# Patient Record
Sex: Female | Born: 1960 | Race: Black or African American | Hispanic: No | Marital: Married | Smoking: Never smoker
Health system: Southern US, Community
[De-identification: ages and names within clinical notes are randomized; demographics above are authoritative.]

## PROBLEM LIST (undated history)

## (undated) DIAGNOSIS — F32A Depression, unspecified: Secondary | ICD-10-CM

## (undated) DIAGNOSIS — F329 Major depressive disorder, single episode, unspecified: Secondary | ICD-10-CM

## (undated) DIAGNOSIS — J45909 Unspecified asthma, uncomplicated: Secondary | ICD-10-CM

## (undated) HISTORY — DX: Unspecified asthma, uncomplicated: J45.909

## (undated) HISTORY — DX: Depression, unspecified: F32.A

## (undated) HISTORY — PX: TONSILLECTOMY: SUR1361

## (undated) HISTORY — PX: ABLATION: SHX5711

## (undated) HISTORY — DX: Major depressive disorder, single episode, unspecified: F32.9

## (undated) HISTORY — PX: APPENDECTOMY: SHX54

## (undated) HISTORY — PX: EYE SURGERY: SHX253

---

## 2003-08-10 ENCOUNTER — Ambulatory Visit (HOSPITAL_COMMUNITY): Admission: RE | Admit: 2003-08-10 | Discharge: 2003-08-11 | Payer: Self-pay | Admitting: Ophthalmology

## 2003-08-10 ENCOUNTER — Encounter: Payer: Self-pay | Admitting: Ophthalmology

## 2003-10-26 ENCOUNTER — Ambulatory Visit (HOSPITAL_COMMUNITY): Admission: RE | Admit: 2003-10-26 | Discharge: 2003-10-27 | Payer: Self-pay | Admitting: Ophthalmology

## 2005-05-22 ENCOUNTER — Emergency Department: Payer: Self-pay | Admitting: Emergency Medicine

## 2005-10-09 ENCOUNTER — Ambulatory Visit: Payer: Self-pay | Admitting: Family Medicine

## 2007-04-18 ENCOUNTER — Emergency Department: Payer: Self-pay | Admitting: Emergency Medicine

## 2007-04-18 ENCOUNTER — Other Ambulatory Visit: Payer: Self-pay

## 2007-09-03 ENCOUNTER — Ambulatory Visit: Payer: Self-pay

## 2007-09-05 ENCOUNTER — Ambulatory Visit: Payer: Self-pay

## 2007-10-28 ENCOUNTER — Ambulatory Visit: Payer: Self-pay

## 2010-04-30 ENCOUNTER — Emergency Department: Payer: Self-pay | Admitting: Emergency Medicine

## 2010-12-01 ENCOUNTER — Emergency Department: Payer: Self-pay | Admitting: Emergency Medicine

## 2011-02-17 ENCOUNTER — Ambulatory Visit: Payer: Self-pay | Admitting: Family Medicine

## 2011-03-17 ENCOUNTER — Emergency Department: Payer: Self-pay | Admitting: Emergency Medicine

## 2012-04-08 ENCOUNTER — Encounter (INDEPENDENT_AMBULATORY_CARE_PROVIDER_SITE_OTHER): Payer: BC Managed Care – PPO

## 2012-04-08 ENCOUNTER — Other Ambulatory Visit: Payer: Self-pay | Admitting: Cardiology

## 2012-04-08 DIAGNOSIS — M79609 Pain in unspecified limb: Secondary | ICD-10-CM

## 2012-04-08 DIAGNOSIS — M7989 Other specified soft tissue disorders: Secondary | ICD-10-CM

## 2012-04-08 DIAGNOSIS — R52 Pain, unspecified: Secondary | ICD-10-CM

## 2012-04-08 DIAGNOSIS — R609 Edema, unspecified: Secondary | ICD-10-CM

## 2012-05-18 ENCOUNTER — Emergency Department: Payer: Self-pay | Admitting: Emergency Medicine

## 2012-05-18 LAB — CBC WITH DIFFERENTIAL/PLATELET
Basophil #: 0 10*3/uL (ref 0.0–0.1)
Basophil %: 0.2 %
Eosinophil #: 0 10*3/uL (ref 0.0–0.7)
Eosinophil %: 0.1 %
HCT: 38.7 % (ref 35.0–47.0)
HGB: 13.3 g/dL (ref 12.0–16.0)
Lymphocyte #: 1 10*3/uL (ref 1.0–3.6)
Lymphocyte %: 9.4 %
MCH: 31.6 pg (ref 26.0–34.0)
MCHC: 34.4 g/dL (ref 32.0–36.0)
MCV: 92 fL (ref 80–100)
Monocyte #: 0.6 x10 3/mm (ref 0.2–0.9)
Monocyte %: 5.8 %
Neutrophil #: 9.5 10*3/uL — ABNORMAL HIGH (ref 1.4–6.5)
Neutrophil %: 84.5 %
Platelet: 315 10*3/uL (ref 150–440)
RBC: 4.21 10*6/uL (ref 3.80–5.20)
RDW: 13.3 % (ref 11.5–14.5)
WBC: 11.2 10*3/uL — ABNORMAL HIGH (ref 3.6–11.0)

## 2012-05-18 LAB — URINALYSIS, COMPLETE
Bacteria: NONE SEEN
Bilirubin,UR: NEGATIVE
Glucose,UR: NEGATIVE mg/dL (ref 0–75)
Ketone: NEGATIVE
Nitrite: NEGATIVE
Ph: 6 (ref 4.5–8.0)
Protein: 30
RBC,UR: 53 /HPF (ref 0–5)
Specific Gravity: 1.026 (ref 1.003–1.030)
Squamous Epithelial: 10
WBC UR: 56 /HPF (ref 0–5)

## 2012-05-18 LAB — COMPREHENSIVE METABOLIC PANEL
Albumin: 3.5 g/dL (ref 3.4–5.0)
Alkaline Phosphatase: 85 U/L (ref 50–136)
Anion Gap: 10 (ref 7–16)
BUN: 15 mg/dL (ref 7–18)
Bilirubin,Total: 0.3 mg/dL (ref 0.2–1.0)
Calcium, Total: 8.8 mg/dL (ref 8.5–10.1)
Chloride: 106 mmol/L (ref 98–107)
Co2: 23 mmol/L (ref 21–32)
Creatinine: 0.9 mg/dL (ref 0.60–1.30)
EGFR (African American): >60
EGFR (Non-African Amer.): >60
Glucose: 88 mg/dL (ref 65–99)
Osmolality: 278 (ref 275–301)
Potassium: 3.6 mmol/L (ref 3.5–5.1)
SGOT(AST): 25 U/L (ref 15–37)
SGPT (ALT): 23 U/L
Sodium: 139 mmol/L (ref 136–145)
Total Protein: 7.5 g/dL (ref 6.4–8.2)

## 2012-05-18 LAB — PROTIME-INR
INR: 0.9
Prothrombin Time: 12.8 secs (ref 11.5–14.7)

## 2012-05-18 LAB — APTT: Activated PTT: 32 secs (ref 23.6–35.9)

## 2012-12-31 ENCOUNTER — Ambulatory Visit: Payer: Self-pay | Admitting: Family Medicine

## 2014-02-03 ENCOUNTER — Ambulatory Visit: Payer: Self-pay

## 2014-05-18 ENCOUNTER — Emergency Department: Payer: Self-pay | Admitting: Emergency Medicine

## 2014-05-18 LAB — COMPREHENSIVE METABOLIC PANEL
Albumin: 3.8 g/dL (ref 3.4–5.0)
Alkaline Phosphatase: 87 U/L
Anion Gap: 7 (ref 7–16)
BUN: 9 mg/dL (ref 7–18)
Bilirubin,Total: 0.3 mg/dL (ref 0.2–1.0)
Calcium, Total: 9.2 mg/dL (ref 8.5–10.1)
Chloride: 107 mmol/L (ref 98–107)
Co2: 25 mmol/L (ref 21–32)
Creatinine: 1.02 mg/dL (ref 0.60–1.30)
EGFR (African American): >60
EGFR (Non-African Amer.): >60
Glucose: 83 mg/dL (ref 65–99)
Osmolality: 275 (ref 275–301)
Potassium: 3.6 mmol/L (ref 3.5–5.1)
SGOT(AST): 31 U/L (ref 15–37)
SGPT (ALT): 33 U/L
Sodium: 139 mmol/L (ref 136–145)
Total Protein: 8.6 g/dL — ABNORMAL HIGH (ref 6.4–8.2)

## 2014-05-18 LAB — URINALYSIS, COMPLETE
Bilirubin,UR: NEGATIVE
Glucose,UR: NEGATIVE mg/dL (ref 0–75)
Ketone: NEGATIVE
Nitrite: NEGATIVE
PH: 8 (ref 4.5–8.0)
Protein: NEGATIVE
RBC,UR: 6 /HPF (ref 0–5)
Specific Gravity: 1.01 (ref 1.003–1.030)
WBC UR: 12 /HPF (ref 0–5)

## 2014-05-18 LAB — CK TOTAL AND CKMB (NOT AT ARMC)
CK, Total: 181 U/L
CK-MB: 1.6 ng/mL (ref 0.5–3.6)

## 2014-05-18 LAB — CBC
HCT: 43.7 % (ref 35.0–47.0)
HGB: 14.4 g/dL (ref 12.0–16.0)
MCH: 30.7 pg (ref 26.0–34.0)
MCHC: 32.9 g/dL (ref 32.0–36.0)
MCV: 93 fL (ref 80–100)
Platelet: 396 10*3/uL (ref 150–440)
RBC: 4.68 10*6/uL (ref 3.80–5.20)
RDW: 14 % (ref 11.5–14.5)
WBC: 8.8 10*3/uL (ref 3.6–11.0)

## 2014-05-18 LAB — TROPONIN I: Troponin-I: <0.02 ng/mL

## 2014-05-20 LAB — URINE CULTURE

## 2015-04-09 ENCOUNTER — Encounter: Payer: Self-pay | Admitting: Family Medicine

## 2015-04-09 ENCOUNTER — Ambulatory Visit (INDEPENDENT_AMBULATORY_CARE_PROVIDER_SITE_OTHER): Payer: BLUE CROSS/BLUE SHIELD | Admitting: Family Medicine

## 2015-04-09 DIAGNOSIS — J4521 Mild intermittent asthma with (acute) exacerbation: Secondary | ICD-10-CM | POA: Diagnosis not present

## 2015-04-09 DIAGNOSIS — J45909 Unspecified asthma, uncomplicated: Secondary | ICD-10-CM | POA: Insufficient documentation

## 2015-04-09 DIAGNOSIS — J45901 Unspecified asthma with (acute) exacerbation: Secondary | ICD-10-CM | POA: Insufficient documentation

## 2015-04-09 DIAGNOSIS — F32A Depression, unspecified: Secondary | ICD-10-CM | POA: Insufficient documentation

## 2015-04-09 DIAGNOSIS — F329 Major depressive disorder, single episode, unspecified: Secondary | ICD-10-CM | POA: Insufficient documentation

## 2015-04-09 MED ORDER — ALBUTEROL SULFATE HFA 108 (90 BASE) MCG/ACT IN AERS: 1.0000 | INHALATION_SPRAY | Freq: Four times a day (QID) | RESPIRATORY_TRACT | Status: DC

## 2015-04-09 MED ORDER — GUAIFENESIN-CODEINE 100-10 MG/5ML PO SOLN: 5.0000 mL | Freq: Three times a day (TID) | ORAL | Status: DC | PRN

## 2015-04-09 MED ORDER — ALBUTEROL SULFATE (2.5 MG/3ML) 0.083% IN NEBU: 2.5000 mg | INHALATION_SOLUTION | Freq: Once | RESPIRATORY_TRACT | Status: AC

## 2015-04-09 MED ORDER — AZITHROMYCIN 250 MG PO TABS: ORAL_TABLET | ORAL | Status: DC

## 2015-04-09 MED ORDER — PREDNISONE 10 MG PO TABS: 10.0000 mg | ORAL_TABLET | Freq: Every day | ORAL | Status: DC

## 2015-04-09 MED ORDER — MOMETASONE FURO-FORMOTEROL FUM 200-5 MCG/ACT IN AERO: 1.0000 | INHALATION_SPRAY | Freq: Two times a day (BID) | RESPIRATORY_TRACT | Status: DC

## 2015-04-09 NOTE — Progress Notes (Signed)
Name: Beverly Mercer   MRN: [id number]    DOB: Dec 30, 1960   Date:04/09/2015       Progress Note  Subjective  Chief Complaint  Chief Complaint  Patient presents with  . Asthma    needs refill on Dulera    Asthma She complains of chest tightness, cough, difficulty breathing, frequent throat clearing, shortness of breath and wheezing. There is no hemoptysis, hoarse voice or sputum production. This is a chronic problem. The current episode started in the past 7 days. The problem occurs constantly. The problem has been unchanged. The cough is non-productive. Pertinent negatives include no chest pain, dyspnea on exertion, ear pain, fever, heartburn or PND. Her symptoms are aggravated by change in weather and pollen. Her symptoms are alleviated by beta-agonist. Her past medical history is significant for asthma. There is no history of bronchiectasis, bronchitis or COPD.    No problem-specific assessment & plan notes found for this encounter.   Past Medical History  Diagnosis Date  . Asthma     History reviewed. No pertinent past surgical history.  Family History  Problem Relation Age of Onset  . Asthma Neg Hx     History   Social History  . Marital Status: Married    Spouse Name: N/A  . Number of Children: N/A  . Years of Education: N/A   Occupational History  . Not on file.   Social History Main Topics  . Smoking status: Never Smoker   . Smokeless tobacco: Not on file  . Alcohol Use: No  . Drug Use: No  . Sexual Activity: Not Currently   Other Topics Concern  . Not on file   Social History Narrative  . No narrative on file    Allergies  Allergen Reactions  . Demerol [Meperidine]      Review of Systems  Constitutional: Negative for fever.  HENT: Negative for ear pain and hoarse voice.   Respiratory: Positive for cough, shortness of breath and wheezing. Negative for hemoptysis and sputum production.   Cardiovascular: Negative for chest pain, dyspnea on  exertion and PND.  Gastrointestinal: Negative for heartburn.     Objective  Filed Vitals:   04/09/15 1136  BP: 130/80  Pulse: 84  Height: 5\' 2"  (1.575 m)  Weight: 173 lb (78.472 kg)  SpO2: 99%    Physical Exam    Assessment & Plan  Problem List Items Addressed This Visit      Respiratory   RAD (reactive airway disease)   Relevant Medications   mometasone-formoterol (DULERA) 200-5 MCG/ACT AERO   albuterol (PROAIR HFA) 108 (90 BASE) MCG/ACT inhaler   azithromycin (ZITHROMAX) 250 MG tablet   predniSONE (DELTASONE) 10 MG tablet   guaiFENesin-codeine 100-10 MG/5ML syrup   albuterol (PROVENTIL) (2.5 MG/3ML) 0.083% nebulizer solution 2.5 mg (Start on 04/09/2015 12:30 PM)        Dr. [facility]  04/09/2015

## 2015-04-10 ENCOUNTER — Emergency Department
Admission: EM | Admit: 2015-04-10 | Discharge: 2015-04-10 | Disposition: A | Discharge status: Home / Self Care | Payer: BLUE CROSS/BLUE SHIELD | Attending: Emergency Medicine | Admitting: Emergency Medicine

## 2015-04-10 ENCOUNTER — Other Ambulatory Visit: Payer: Self-pay

## 2015-04-10 ENCOUNTER — Emergency Department: Payer: BLUE CROSS/BLUE SHIELD

## 2015-04-10 ENCOUNTER — Encounter: Payer: Self-pay | Admitting: Emergency Medicine

## 2015-04-10 DIAGNOSIS — J4521 Mild intermittent asthma with (acute) exacerbation: Secondary | ICD-10-CM | POA: Diagnosis not present

## 2015-04-10 DIAGNOSIS — R05 Cough: Secondary | ICD-10-CM | POA: Diagnosis present

## 2015-04-10 DIAGNOSIS — J209 Acute bronchitis, unspecified: Secondary | ICD-10-CM

## 2015-04-10 DIAGNOSIS — Z792 Long term (current) use of antibiotics: Secondary | ICD-10-CM | POA: Diagnosis not present

## 2015-04-10 DIAGNOSIS — Z7952 Long term (current) use of systemic steroids: Secondary | ICD-10-CM | POA: Insufficient documentation

## 2015-04-10 LAB — COMPREHENSIVE METABOLIC PANEL
ALBUMIN: 4.3 g/dL (ref 3.5–5.0)
ALT: 23 U/L (ref 14–54)
AST: 32 U/L (ref 15–41)
Alkaline Phosphatase: 78 U/L (ref 38–126)
Anion gap: 8 (ref 5–15)
BUN: 14 mg/dL (ref 6–20)
CHLORIDE: 106 mmol/L (ref 101–111)
CO2: 24 mmol/L (ref 22–32)
CREATININE: 0.86 mg/dL (ref 0.44–1.00)
Calcium: 9.7 mg/dL (ref 8.9–10.3)
GFR calc Af Amer: >60 mL/min (ref >60)
GFR calc non Af Amer: >60 mL/min (ref >60)
Glucose, Bld: 141 mg/dL — ABNORMAL HIGH (ref 65–99)
Potassium: 4 mmol/L (ref 3.5–5.1)
Sodium: 138 mmol/L (ref 135–145)
TOTAL PROTEIN: 8.4 g/dL — AB (ref 6.5–8.1)
Total Bilirubin: 0.4 mg/dL (ref 0.3–1.2)

## 2015-04-10 LAB — CBC
HCT: 42.4 % (ref 35.0–47.0)
HEMOGLOBIN: 13.9 g/dL (ref 12.0–16.0)
MCH: 31.6 pg (ref 26.0–34.0)
MCHC: 32.9 g/dL (ref 32.0–36.0)
MCV: 96 fL (ref 80.0–100.0)
Platelets: 377 10*3/uL (ref 150–440)
RBC: 4.42 MIL/uL (ref 3.80–5.20)
RDW: 14.3 % (ref 11.5–14.5)
WBC: 6.7 10*3/uL (ref 3.6–11.0)

## 2015-04-10 LAB — TROPONIN I

## 2015-04-10 MED ORDER — IPRATROPIUM-ALBUTEROL 0.5-2.5 (3) MG/3ML IN SOLN
RESPIRATORY_TRACT | Status: AC
  Filled 2015-04-10: qty 3

## 2015-04-10 MED ORDER — IPRATROPIUM-ALBUTEROL 0.5-2.5 (3) MG/3ML IN SOLN
RESPIRATORY_TRACT | Status: AC
  Administered 2015-04-10: 3 mL via RESPIRATORY_TRACT
  Filled 2015-04-10: qty 3

## 2015-04-10 MED ORDER — PREDNISONE 20 MG PO TABS
ORAL_TABLET | ORAL | Status: AC
  Administered 2015-04-10: 60 mg via ORAL
  Filled 2015-04-10: qty 3

## 2015-04-10 MED ORDER — PREDNISONE 20 MG PO TABS
60.0000 mg | ORAL_TABLET | Freq: Once | ORAL | Status: AC
  Administered 2015-04-10: 60 mg via ORAL

## 2015-04-10 MED ORDER — IPRATROPIUM-ALBUTEROL 0.5-2.5 (3) MG/3ML IN SOLN
3.0000 mL | Freq: Once | RESPIRATORY_TRACT | Status: AC
  Administered 2015-04-10: 3 mL via RESPIRATORY_TRACT

## 2015-04-10 NOTE — ED Provider Notes (Signed)
EKG interpreted by me Normal sinus rhythm rate of 77, normal axis and intervals, poor R progression in anterior precordial leads, normal ST segments and T waves.  Sharman Cheek, MD 04/10/15 1620

## 2015-04-10 NOTE — ED Notes (Signed)
Patient states she saw PCP yesterday for asthma attack, was started on antibiotics. Patient denies any productive sputum, but reports "lots of coughing".

## 2015-04-10 NOTE — Discharge Instructions (Signed)
Asthma, Acute Bronchospasm °Acute bronchospasm caused by asthma is also referred to as an asthma attack. Bronchospasm means your air passages become narrowed. The narrowing is caused by inflammation and tightening of the muscles in the air tubes (bronchi) in your lungs. This can make it hard to breathe or cause you to wheeze and cough. °CAUSES °Possible triggers are: °· Animal dander from the skin, hair, or feathers of animals. °· Dust mites contained in house dust. °· Cockroaches. °· Pollen from trees or grass. °· Mold. °· Cigarette or tobacco smoke. °· Air pollutants such as dust, household cleaners, hair sprays, aerosol sprays, paint fumes, strong chemicals, or strong odors. °· Cold air or weather changes. Cold air may trigger inflammation. Winds increase molds and pollens in the air. °· Strong emotions such as crying or laughing hard. °· Stress. °· Certain medicines such as aspirin or beta-blockers. °· Sulfites in foods and drinks, such as dried fruits and wine. °· Infections or inflammatory conditions, such as a flu, cold, or inflammation of the nasal membranes (rhinitis). °· Gastroesophageal reflux disease (GERD). GERD is a condition where stomach acid backs up into your esophagus. °· Exercise or strenuous activity. °SIGNS AND SYMPTOMS  °· Wheezing. °· Excessive coughing, particularly at night. °· Chest tightness. °· Shortness of breath. °DIAGNOSIS  °Your health care provider will ask you about your medical history and perform a physical exam. A chest X-ray or blood testing may be performed to look for other causes of your symptoms or other conditions that may have triggered your asthma attack.  °TREATMENT  °Treatment is aimed at reducing inflammation and opening up the airways in your lungs.  Most asthma attacks are treated with inhaled medicines. These include quick relief or rescue medicines (such as bronchodilators) and controller medicines (such as inhaled corticosteroids). These medicines are sometimes  given through an inhaler or a nebulizer. Systemic steroid medicine taken by mouth or given through an IV tube also can be used to reduce the inflammation when an attack is moderate or severe. Antibiotic medicines are only used if a bacterial infection is present.  °HOME CARE INSTRUCTIONS  °· Rest. °· Drink plenty of liquids. This helps the mucus to remain thin and be easily coughed up. Only use caffeine in moderation and do not use alcohol until you have recovered from your illness. °· Do not smoke. Avoid being exposed to secondhand smoke. °· You play a critical role in keeping yourself in good health. Avoid exposure to things that cause you to wheeze or to have breathing problems. °· Keep your medicines up-to-date and available. Carefully follow your health care provider's treatment plan. °· Take your medicine exactly as prescribed. °· When pollen or pollution is bad, keep windows closed and use an air conditioner or go to places with air conditioning. °· Asthma requires careful medical care. See your health care provider for a follow-up as advised. If you are more than [redacted] weeks pregnant and you were prescribed any new medicines, let your obstetrician know about the visit and how you are doing. Follow up with your health care provider as directed. °· After you have recovered from your asthma attack, make an appointment with your outpatient doctor to talk about ways to reduce the likelihood of future attacks. If you do not have a doctor who manages your asthma, make an appointment with a primary care doctor to discuss your asthma. °SEEK IMMEDIATE MEDICAL CARE IF:  °· You are getting worse. °· You have trouble breathing. If severe, call your local   emergency services (911 in the [place]  You develop chest pain or discomfort.  You are vomiting.  You are not able to keep fluids down.  You are coughing up yellow, green, brown, or bloody sputum.  You have a fever and your symptoms suddenly get worse.  You have  trouble swallowing. MAKE SURE YOU:   Understand these instructions.  Will watch your condition.  Will get help right away if you are not doing well or get worse. Document Released: 01/24/2007 Document Revised: 10/14/2013 Document Reviewed: 04/16/2013 [facility] Patient Information 2015 [place], [place]. This information is not intended to replace advice given to you by your health care provider. Make sure you discuss any questions you have with your health care provider.   FOLLOW UP WITH YOUR DOCTOR ON Monday IF ANY CONTINUED PROBLEMS  BEGIN WITH PREDNISONE TABLETS 5 PILLS TOMORROW AND TAPER DOWN AS ALREADY DISCUSSED CONTINUE INHALER AND COUGH MEDICATION, FINISH YOUR ZITHROMAX RETURN TO ER IF ANY SEVERE WORSENING OF YOUR ASTHMA

## 2015-04-10 NOTE — ED Notes (Signed)
NAD noted at time of D/C. Pt denies questions or concerns. Pt ambulatory to the lobby at this time. Pt refused wheelchair at this time.  

## 2015-04-10 NOTE — ED Provider Notes (Signed)
[facility] Emergency Department Provider Note  ____________________________________________  Time seen: 1600  I have reviewed the triage vital signs and the nursing notes.   HISTORY  Chief Complaint Asthma; Cough; and Shortness of Breath   HPI Beverly Mercer is a 54 y.o. female patient is here today with complaint of continued cough. She states she was seen by her PCP yesterday and started on Zithromax, a codeine cough syrup, albuterol and prednisone. She was unable to sleep last evening due to coughing.She has a history of asthma and has been on the above medications in the past. She denies any smoking. She is unaware of any fever. Currently she rates her pain 7 out of 10 mostly from coughing. Nothing seems to be improving her cough including cough medicine. On looking at the bilateral her prednisone medication is for one per day. Patient states this is the lowest dose of prednisone she's ever been on for her asthma attacks.   Past Medical History  Diagnosis Date  . Asthma     Patient Active Problem List   Diagnosis Date Noted  . Acute severe exacerbation of asthma 04/09/2015  . Clinical depression 04/09/2015  . RAD (reactive airway disease) 04/09/2015    Past Surgical History  Procedure Laterality Date  . Tonsillectomy    . Appendectomy    . Eye surgery    . Ablation      Uterus    Current Outpatient Rx  Name  Route  Sig  Dispense  Refill  . albuterol (PROAIR HFA) 108 (90 BASE) MCG/ACT inhaler   Inhalation   Inhale 1 puff into the lungs 4 (four) times daily.   1 Inhaler   11   . azithromycin (ZITHROMAX) 250 MG tablet      2 today then one a day   6 tablet   0   . guaiFENesin-codeine 100-10 MG/5ML syrup   Oral   Take 5 mLs by mouth 3 (three) times daily as needed for cough.   120 mL   0   . mometasone-formoterol (DULERA) 200-5 MCG/ACT AERO   Inhalation   Inhale 1 puff into the lungs 2 (two) times daily.   1 Inhaler   11   .  predniSONE (DELTASONE) 10 MG tablet   Oral   Take 1 tablet (10 mg total) by mouth daily with breakfast.   33 tablet   0   . sertraline (ZOLOFT) 50 MG tablet   Oral   Take 1 tablet by mouth daily.           Allergies Demerol  Family History  Problem Relation Age of Onset  . Asthma Neg Hx     Social History History  Substance Use Topics  . Smoking status: Never Smoker   . Smokeless tobacco: Not on file  . Alcohol Use: No    Review of Systems Constitutional: No fever/chills Eyes: No visual changes. ENT: No sore throat. Cardiovascular: Positive chest pain due to frequent coughing per patient. Respiratory: Denies shortness of breath. Productive cough positive Gastrointestinal: No abdominal pain.  No nausea, no vomiting.  Genitourinary: Negative for dysuria. Musculoskeletal: Negative for back pain. Skin: Negative for rash. Neurological: Negative for headaches, focal weakness or numbness.  10-point ROS otherwise negative.  ____________________________________________   PHYSICAL EXAM:  VITAL SIGNS: ED Triage Vitals  Enc Vitals Group     BP 04/10/15 1155 140/76 mmHg     Pulse Rate 04/10/15 1155 77     Resp 04/10/15 1155 24  Temp 04/10/15 1155 99.1 F (37.3 C)     Temp Source 04/10/15 1155 Oral     SpO2 04/10/15 1155 97 %     Weight 04/10/15 1155 170 lb (77.111 kg)     Height 04/10/15 1155  (1.575 m)     Head Cir --      Peak Flow --      Pain Score 04/10/15 1156 7     Pain Loc --      Pain Edu? --      Excl. in GC? --     Constitutional: Alert and oriented. Well appearing and in no acute distress. Eyes: Conjunctivae are normal. PERRL. EOMI. Head: Atraumatic. Nose: No congestion/rhinnorhea. Mouth/Throat: Mucous membranes are moist.  Oropharynx non-erythematous. Neck: No stridor.  Supple Hematological/Lymphatic/Immunilogical: No cervical lymphadenopathy. Cardiovascular: Normal rate, regular rhythm. Grossly normal heart sounds.  Good peripheral  circulation. Respiratory: Normal respiratory effort.  No retractions. Lungs bilateral expiratory wheezes noted throughout. Patient is able to talk in complete sentences but does have frequent cough Gastrointestinal: Soft and nontender. No distention.  Musculoskeletal: No lower extremity tenderness nor edema.  No joint effusions. Neurologic:  Normal speech and language. No gross focal neurologic deficits are appreciated. Speech is normal. No gait instability. Skin:  Skin is warm, dry and intact. No rash noted. Psychiatric: Mood and affect are normal. Speech and behavior are normal.  ____________________________________________   LABS (all labs ordered are listed, but only abnormal results are displayed)  Labs Reviewed  COMPREHENSIVE METABOLIC PANEL - Abnormal; Notable for the following:    Glucose, Bld 141 (*)    Total Protein 8.4 (*)    All other components within normal limits  CBC  TROPONIN I   ____________________________________________  EKG  Normal sinus rhythm with a rate of 77 also note was dictated by Dr. Scotty Court ____________________________________________  RADIOLOGY  Chest x-ray shows stable mild chronic bronchitic changes. No acute abnormality ____________________________________________   PROCEDURES  Procedure(s) performed: None  Critical Care performed: No  ____________________________________________   INITIAL IMPRESSION / ASSESSMENT AND PLAN / ED COURSE  Pertinent labs & imaging results that were available during my care of the patient were reviewed by me and considered in my medical decision making (see chart for details).  Patient states that there is a huge difference after having had a DuoNeb treatment. We discussed increasing her prednisone and she was given 60 mg in the emergency room. She has not medication that she can do a taper beginning tomorrow. She is to continue her medications. She is return to the emergency room if any severe worsening of  her symptoms or urgent concerns. ____________________________________________   FINAL CLINICAL IMPRESSION(S) / ED DIAGNOSES  Final diagnoses:  Asthma with acute exacerbation, mild intermittent  Acute bronchitis, unspecified organism      Tommi Rumps, PA-C 04/10/15 1858  Sharman Cheek, MD 04/11/15 0004

## 2015-12-17 ENCOUNTER — Encounter: Payer: Self-pay | Admitting: Family Medicine

## 2015-12-17 ENCOUNTER — Ambulatory Visit (INDEPENDENT_AMBULATORY_CARE_PROVIDER_SITE_OTHER): Payer: Self-pay | Admitting: Family Medicine

## 2015-12-17 VITALS — BP 138/70 | HR 86 | Temp 97.8°F | Ht 62.0 in | Wt 176.0 lb

## 2015-12-17 DIAGNOSIS — J0100 Acute maxillary sinusitis, unspecified: Secondary | ICD-10-CM

## 2015-12-17 DIAGNOSIS — J4521 Mild intermittent asthma with (acute) exacerbation: Secondary | ICD-10-CM

## 2015-12-17 DIAGNOSIS — J40 Bronchitis, not specified as acute or chronic: Secondary | ICD-10-CM

## 2015-12-17 MED ORDER — GUAIFENESIN-CODEINE 100-10 MG/5ML PO SYRP: 5.0000 mL | ORAL_SOLUTION | Freq: Three times a day (TID) | ORAL | Status: DC | PRN

## 2015-12-17 MED ORDER — MOMETASONE FURO-FORMOTEROL FUM 200-5 MCG/ACT IN AERO: 1.0000 | INHALATION_SPRAY | Freq: Two times a day (BID) | RESPIRATORY_TRACT | Status: DC

## 2015-12-17 MED ORDER — ALBUTEROL SULFATE (2.5 MG/3ML) 0.083% IN NEBU: 2.5000 mg | INHALATION_SOLUTION | Freq: Four times a day (QID) | RESPIRATORY_TRACT | Status: AC | PRN

## 2015-12-17 MED ORDER — AZITHROMYCIN 250 MG PO TABS: ORAL_TABLET | ORAL | Status: DC

## 2015-12-17 MED ORDER — ALBUTEROL SULFATE HFA 108 (90 BASE) MCG/ACT IN AERS: 1.0000 | INHALATION_SPRAY | Freq: Four times a day (QID) | RESPIRATORY_TRACT | Status: AC

## 2015-12-17 NOTE — Progress Notes (Signed)
Name: Beverly Mercer   MRN: [id number]    DOB: 12-21-1960   Date:12/17/2015       Progress Note  Subjective  Chief Complaint  Chief Complaint  Patient presents with  . Sinusitis    cough and hoarse- no production  . Asthma    needs refill on nebulizer solution    Sinusitis This is a new problem. The current episode started in the past 7 days. The problem has been waxing and waning since onset. There has been no fever. She is experiencing no pain. Associated symptoms include chills, congestion, coughing, headaches, a hoarse voice, neck pain, shortness of breath, sinus pressure, sneezing and a sore throat. Pertinent negatives include no diaphoresis or ear pain. Past treatments include acetaminophen and oral decongestants. The treatment provided mild relief.  Asthma She complains of cough, hoarse voice and shortness of breath. There is no sputum production or wheezing. Associated symptoms include headaches, sneezing and a sore throat. Pertinent negatives include no chest pain, ear pain, fever, heartburn, malaise/fatigue, myalgias or weight loss. Her past medical history is significant for asthma.  Cough The current episode started in the past 7 days. The problem has been waxing and waning. The cough is non-productive. Associated symptoms include chills, headaches, a sore throat and shortness of breath. Pertinent negatives include no chest pain, ear pain, fever, heartburn, myalgias, rash, weight loss or wheezing. She has tried a beta-agonist inhaler and steroid inhaler for the symptoms. Her past medical history is significant for asthma. There is no history of environmental allergies.    No problem-specific assessment & plan notes found for this encounter.   Past Medical History  Diagnosis Date  . Asthma   . Depression     Past Surgical History  Procedure Laterality Date  . Tonsillectomy    . Appendectomy    . Eye surgery    . Ablation      Uterus    Family History  Problem  Relation Age of Onset  . Asthma Neg Hx     Social History   Social History  . Marital Status: Married    Spouse Name: N/A  . Number of Children: N/A  . Years of Education: N/A   Occupational History  . Not on file.   Social History Main Topics  . Smoking status: Never Smoker   . Smokeless tobacco: Not on file  . Alcohol Use: No  . Drug Use: No  . Sexual Activity: Not Currently   Other Topics Concern  . Not on file   Social History Narrative    Allergies  Allergen Reactions  . Demerol [Meperidine]      Review of Systems  Constitutional: Positive for chills. Negative for fever, weight loss, malaise/fatigue and diaphoresis.  HENT: Positive for congestion, hoarse voice, sinus pressure, sneezing and sore throat. Negative for ear discharge and ear pain.   Eyes: Negative for blurred vision.  Respiratory: Positive for cough and shortness of breath. Negative for sputum production and wheezing.   Cardiovascular: Negative for chest pain, palpitations and leg swelling.  Gastrointestinal: Negative for heartburn, nausea, abdominal pain, diarrhea, constipation, blood in stool and melena.  Genitourinary: Negative for dysuria, urgency, frequency and hematuria.  Musculoskeletal: Positive for neck pain. Negative for myalgias, back pain and joint pain.  Skin: Negative for rash.  Neurological: Positive for headaches. Negative for dizziness, tingling, sensory change and focal weakness.  Endo/Heme/Allergies: Negative for environmental allergies and polydipsia. Does not bruise/bleed easily.  Psychiatric/Behavioral: Negative for depression and suicidal  ideas. The patient is not nervous/anxious and does not have insomnia.      Objective  Filed Vitals:   12/17/15 1607  BP: 138/70  Pulse: 86  Temp: 97.8 F (36.6 C)  TempSrc: Oral  Height:  (1.575 m)  Weight: 176 lb (79.833 kg)  SpO2: 99%    Physical Exam  Constitutional: She is well-developed, well-nourished, and in no  distress. No distress.  HENT:  Head: Normocephalic and atraumatic.  Right Ear: Tympanic membrane, external ear and ear canal normal.  Left Ear: Tympanic membrane, external ear and ear canal normal.  Nose: Nose normal.  Mouth/Throat: Oropharynx is clear and moist.  Eyes: Conjunctivae and EOM are normal. Pupils are equal, round, and reactive to light. Right eye exhibits no discharge. Left eye exhibits no discharge.  Neck: Normal range of motion. Neck supple. No JVD present. No thyromegaly present.  Cardiovascular: Normal rate, regular rhythm, normal heart sounds and intact distal pulses.  Exam reveals no gallop and no friction rub.   No murmur heard. Pulmonary/Chest: Effort normal and breath sounds normal.  Abdominal: Soft. Bowel sounds are normal. She exhibits no mass. There is no tenderness. There is no guarding.  Musculoskeletal: Normal range of motion. She exhibits no edema.  Lymphadenopathy:    She has no cervical adenopathy.  Neurological: She is alert.  Skin: Skin is warm and dry. She is not diaphoretic.  Psychiatric: Mood and affect normal.      Assessment & Plan  Problem List Items Addressed This Visit      Respiratory   RAD (reactive airway disease)   Relevant Medications   albuterol (PROAIR HFA) 108 (90 Base) MCG/ACT inhaler   mometasone-formoterol (DULERA) 200-5 MCG/ACT AERO   albuterol (PROVENTIL) (2.5 MG/3ML) 0.083% nebulizer solution    Other Visit Diagnoses    Acute maxillary sinusitis, recurrence not specified    -  Primary    Relevant Medications    azithromycin (ZITHROMAX) 250 MG tablet    guaiFENesin-codeine (ROBITUSSIN AC) 100-10 MG/5ML syrup    Bronchitis        Relevant Medications    azithromycin (ZITHROMAX) 250 MG tablet    guaiFENesin-codeine (ROBITUSSIN AC) 100-10 MG/5ML syrup         Dr. [facility]  12/17/2015

## 2015-12-22 ENCOUNTER — Other Ambulatory Visit: Payer: Self-pay

## 2015-12-22 MED ORDER — DOXYCYCLINE HYCLATE 100 MG PO TABS: 100.0000 mg | ORAL_TABLET | Freq: Two times a day (BID) | ORAL | Status: DC

## 2016-04-27 ENCOUNTER — Other Ambulatory Visit: Payer: Self-pay

## 2016-04-27 MED ORDER — SERTRALINE HCL 50 MG PO TABS: 50.0000 mg | ORAL_TABLET | Freq: Every day | ORAL | Status: DC

## 2016-11-04 IMAGING — CR DG CHEST 2V
1 series · 2 of 2 positions shown · non-contrast
Comparison: 12/31/2012.

CLINICAL DATA: Cough, wheezing and shortness of breath for the past
4 days. History of asthma.

EXAM:
CHEST  2 VIEW

[Series 1: dg chest 2 view · 0.14mm/px · 2 of 2 slices shown]
[im 1/2]
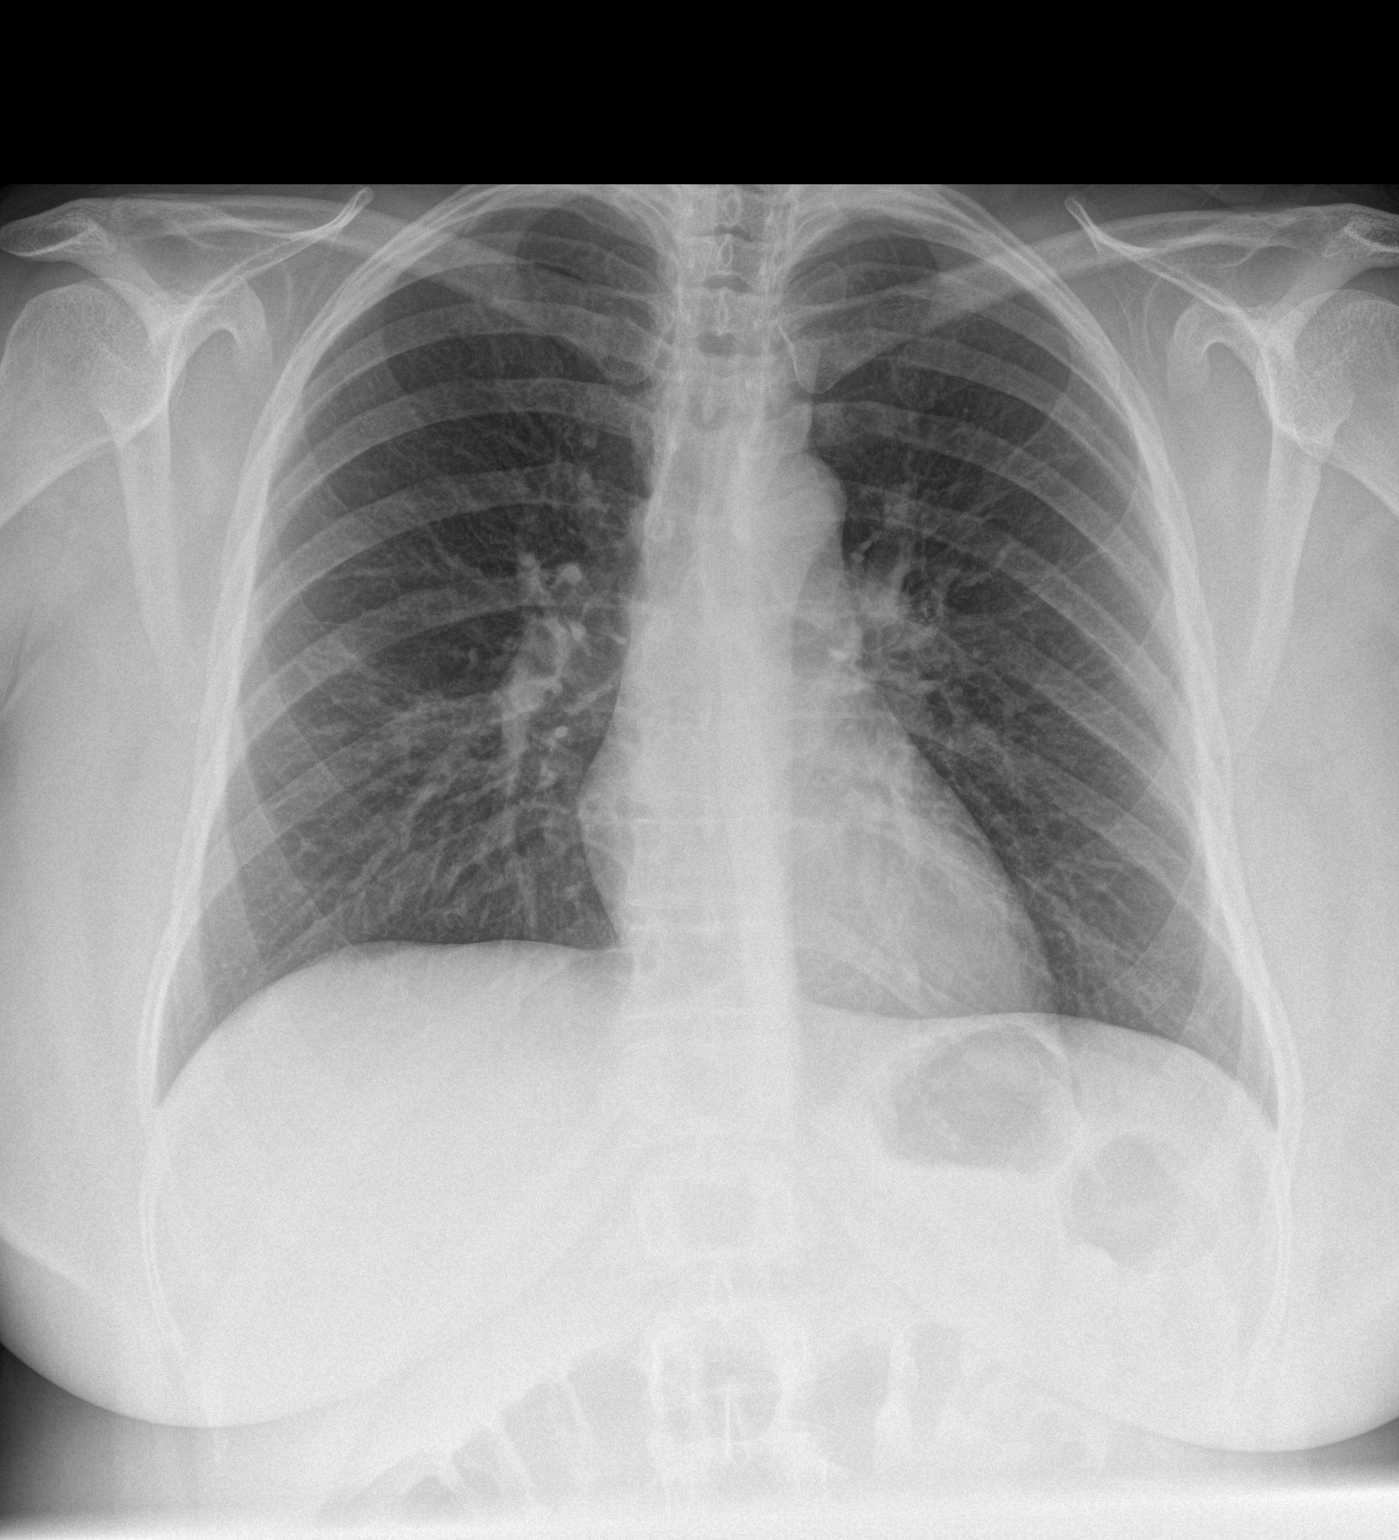
[im 2/2]
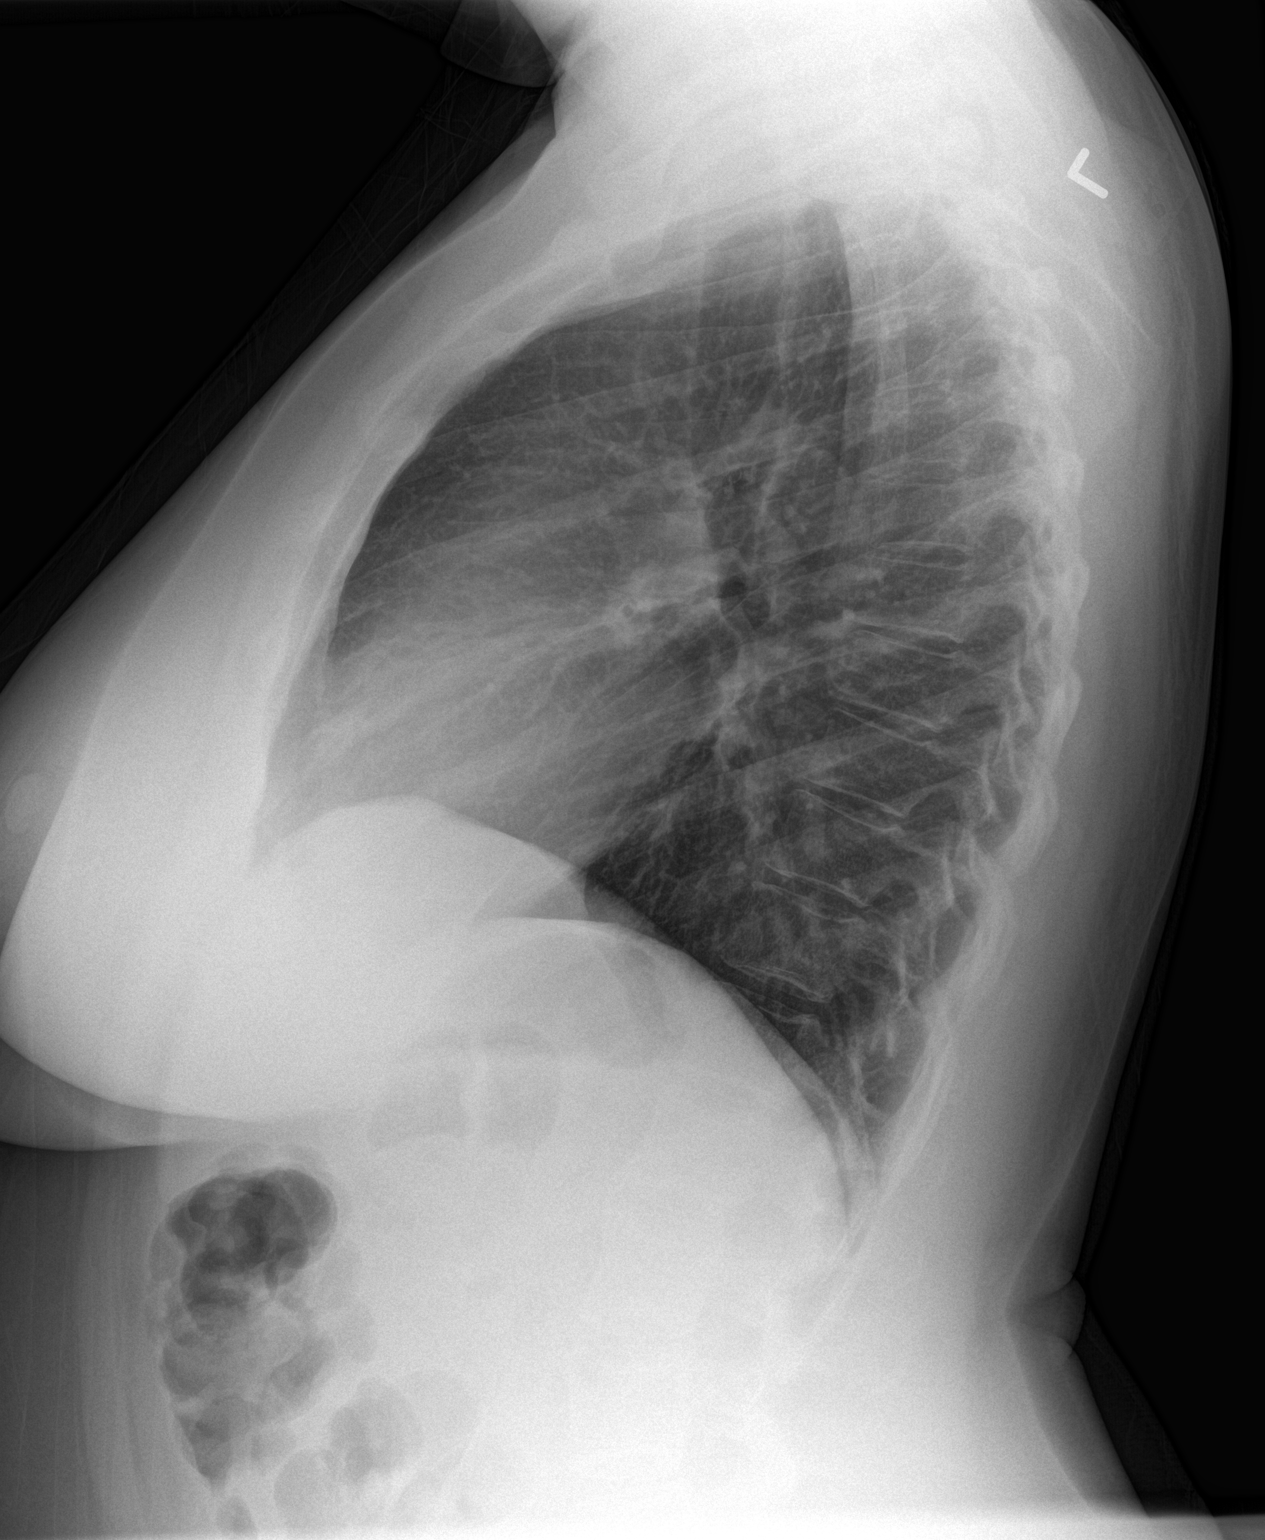

[2 of 2 positions shown; findings below may reference images not displayed]

FINDINGS: Normal sized heart. Clear lungs. Mild diffuse peribronchial
thickening. Mild scoliosis.
IMPRESSION: Stable mild chronic bronchitic changes.  No acute abnormality.

## 2017-02-13 ENCOUNTER — Encounter: Payer: Self-pay | Admitting: Emergency Medicine

## 2017-02-13 ENCOUNTER — Emergency Department
Admission: EM | Admit: 2017-02-13 | Discharge: 2017-02-13 | Disposition: A | Discharge status: Home / Self Care | Payer: Self-pay | Attending: Emergency Medicine | Admitting: Emergency Medicine

## 2017-02-13 DIAGNOSIS — R55 Syncope and collapse: Secondary | ICD-10-CM | POA: Insufficient documentation

## 2017-02-13 DIAGNOSIS — R42 Dizziness and giddiness: Secondary | ICD-10-CM

## 2017-02-13 DIAGNOSIS — J45909 Unspecified asthma, uncomplicated: Secondary | ICD-10-CM | POA: Insufficient documentation

## 2017-02-13 DIAGNOSIS — E876 Hypokalemia: Secondary | ICD-10-CM | POA: Insufficient documentation

## 2017-02-13 LAB — URINALYSIS, COMPLETE (UACMP) WITH MICROSCOPIC
Bilirubin Urine: NEGATIVE
GLUCOSE, UA: NEGATIVE mg/dL
Ketones, ur: NEGATIVE mg/dL
Nitrite: NEGATIVE
Protein, ur: NEGATIVE mg/dL
SPECIFIC GRAVITY, URINE: 1.004 — AB (ref 1.005–1.030)
pH: 8 (ref 5.0–8.0)

## 2017-02-13 LAB — COMPREHENSIVE METABOLIC PANEL
ALK PHOS: 75 U/L (ref 38–126)
ALT: 23 U/L (ref 14–54)
AST: 26 U/L (ref 15–41)
Albumin: 4.4 g/dL (ref 3.5–5.0)
Anion gap: 10 (ref 5–15)
BILIRUBIN TOTAL: 0.7 mg/dL (ref 0.3–1.2)
BUN: 12 mg/dL (ref 6–20)
CALCIUM: 9.4 mg/dL (ref 8.9–10.3)
CO2: 25 mmol/L (ref 22–32)
CREATININE: 0.67 mg/dL (ref 0.44–1.00)
Chloride: 101 mmol/L (ref 101–111)
GFR calc non Af Amer: >60 mL/min (ref >60)
Glucose, Bld: 119 mg/dL — ABNORMAL HIGH (ref 65–99)
Potassium: 3 mmol/L — ABNORMAL LOW (ref 3.5–5.1)
Sodium: 136 mmol/L (ref 135–145)
Total Protein: 8.3 g/dL — ABNORMAL HIGH (ref 6.5–8.1)

## 2017-02-13 LAB — CBC
HCT: 40.8 % (ref 35.0–47.0)
Hemoglobin: 14 g/dL (ref 12.0–16.0)
MCH: 29.4 pg (ref 26.0–34.0)
MCHC: 34.3 g/dL (ref 32.0–36.0)
MCV: 85.9 fL (ref 80.0–100.0)
Platelets: 348 10*3/uL (ref 150–440)
RBC: 4.76 MIL/uL (ref 3.80–5.20)
RDW: 13.7 % (ref 11.5–14.5)
WBC: 11.7 10*3/uL — ABNORMAL HIGH (ref 3.6–11.0)

## 2017-02-13 LAB — TROPONIN I: Troponin I: <0.03 ng/mL (ref <0.03)

## 2017-02-13 MED ORDER — POTASSIUM CHLORIDE CRYS ER 20 MEQ PO TBCR
40.0000 meq | EXTENDED_RELEASE_TABLET | Freq: Once | ORAL | Status: AC
  Administered 2017-02-13: 40 meq via ORAL
  Filled 2017-02-13: qty 2

## 2017-02-13 MED ORDER — SODIUM CHLORIDE 0.9 % IV BOLUS (SEPSIS)
1000.0000 mL | Freq: Once | INTRAVENOUS | Status: AC
  Administered 2017-02-13: 1000 mL via INTRAVENOUS

## 2017-02-13 NOTE — ED Provider Notes (Signed)
[facility] Emergency Department Provider Note   ____________________________________________   First MD Initiated Contact with Patient 02/13/17 0231     (approximate)  I have reviewed the triage vital signs and the nursing notes.   HISTORY  Chief Complaint Near Syncope    HPI Beverly Mercer is a 56 y.o. female who comes into the hospital today with the syncopal versus presyncopal episode. The patient was at work when she became dizzy. She reports that she felt like she was going to pass out. The patient looked dazed according to a coworker. She reports though that she was able to ease herself to the floor did not fall or hit her head. The patient denies any headache or chest pain during before after the episode. She reports that she did feel nauseous though. 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 She was feeling well tonight and reports that this all came on suddenly. She states that her hands felt crampy and tingly but the feeling passed and has not returned. The patient denies any weakness. She reports that she had been drinking well and had eaten peanut butter crackers and was drinking Powerade at the time of the event.The patient is here today for evaluation.   Past Medical History:  Diagnosis Date  . Asthma   . Depression     Patient Active Problem List   Diagnosis Date Noted  . Acute severe exacerbation of asthma 04/09/2015  . Clinical depression 04/09/2015  . RAD (reactive airway disease) 04/09/2015    Past Surgical History:  Procedure Laterality Date  . ABLATION     Uterus  . APPENDECTOMY    . EYE SURGERY    . TONSILLECTOMY      Prior to Admission medications   Medication Sig Start Date End Date Taking? Authorizing Provider  albuterol (PROAIR HFA) 108 (90 Base) MCG/ACT inhaler Inhale 1 puff into the lungs 4 (four) times daily. 12/17/15   Duanne Limerick, MD  albuterol (PROVENTIL) (2.5  MG/3ML) 0.083% nebulizer solution Take 3 mLs (2.5 mg total) by nebulization every 6 (six) hours as needed for wheezing or shortness of breath. 12/17/15   Duanne Limerick, MD  doxycycline (VIBRA-TABS) 100 MG tablet Take 1 tablet (100 mg total) by mouth 2 (two) times daily. 12/22/15   Duanne Limerick, MD  guaiFENesin-codeine (ROBITUSSIN AC) 100-10 MG/5ML syrup Take 5 mLs by mouth 3 (three) times daily as needed for cough. 12/17/15   Duanne Limerick, MD  mometasone-formoterol (DULERA) 200-5 MCG/ACT AERO Inhale 1 puff into the lungs 2 (two) times daily. 12/17/15   Duanne Limerick, MD  sertraline (ZOLOFT) 50 MG tablet Take 1 tablet (50 mg total) by mouth daily. 04/27/16   Duanne Limerick, MD    Allergies Demerol [meperidine]  Family History  Problem Relation Age of Onset  . Asthma Neg Hx     Social History Social History  Substance Use Topics  . Smoking status: Never Smoker  . Smokeless tobacco: Never Used  . Alcohol use No    Review of Systems  Constitutional: No fever/chills Eyes: No visual changes. ENT: No sore throat. Cardiovascular: Denies chest pain. Respiratory: Denies shortness of breath. Gastrointestinal: Nausea with No abdominal pain.  No nausea, no vomiting.  No diarrhea.  No constipation. Genitourinary: Negative for dysuria. Musculoskeletal: Negative for back pain. Skin: Negative for rash. Neurological: Syncope   ____________________________________________   PHYSICAL EXAM:  VITAL SIGNS:  ED Triage Vitals  Enc Vitals Group     BP 02/13/17 0238 (!) 160/97     Pulse Rate 02/13/17 0238 81     Resp 02/13/17 0238 16     Temp 02/13/17 0238 98.3 F (36.8 C)     Temp Source 02/13/17 0238 Oral     SpO2 02/13/17 0238 100 %     Weight 02/13/17 0239 265 lb (120.2 kg)     Height 02/13/17 0239  (1.575 m)     Head Circumference --      Peak Flow --      Pain Score 02/13/17 0234 0     Pain Loc --      Pain Edu? --      Excl. in GC? --     Constitutional: Alert and  oriented. Well appearing and in no acute distress. Eyes: Conjunctivae are normal. PERRL. EOMI., Right eye artificial Head: Atraumatic. Nose: No congestion/rhinnorhea. Mouth/Throat: Mucous membranes are moist.  Oropharynx non-erythematous. Cardiovascular: Normal rate, regular rhythm. Grossly normal heart sounds.  Good peripheral circulation. Respiratory: Normal respiratory effort.  No retractions. Lungs CTAB. Gastrointestinal: Soft and nontender. No distention. Positive bowel sounds Musculoskeletal: No lower extremity tenderness nor edema. Neurologic:  Normal speech and language. Cranial nerves II through XII are grossly intact with no focal motor or neuro deficits Skin:  Skin is warm, dry and intact. No rash noted. Psychiatric: Mood and affect are normal. Speech and behavior are normal.  ____________________________________________   LABS (all labs ordered are listed, but only abnormal results are displayed)  Labs Reviewed  CBC - Abnormal; Notable for the following:       Result Value   WBC 11.7 (*)    All other components within normal limits  COMPREHENSIVE METABOLIC PANEL - Abnormal; Notable for the following:    Potassium 3.0 (*)    Glucose, Bld 119 (*)    Total Protein 8.3 (*)    All other components within normal limits  URINALYSIS, COMPLETE (UACMP) WITH MICROSCOPIC - Abnormal; Notable for the following:    Color, Urine STRAW (*)    APPearance CLEAR (*)    Specific Gravity, Urine 1.004 (*)    Hgb urine dipstick SMALL (*)    Leukocytes, UA TRACE (*)    Bacteria, UA RARE (*)    Squamous Epithelial / LPF 0-5 (*)    All other components within normal limits  TROPONIN I  TROPONIN I   ____________________________________________  EKG  ED ECG REPORT I, Rebecka Apley, the attending physician, personally viewed and interpreted this ECG.   Date: 02/13/2017  EKG Time: 234  Rate: 78  Rhythm: normal sinus rhythm  Axis: normal  Intervals:none  ST&T Change:  none  ____________________________________________  RADIOLOGY  none ____________________________________________   PROCEDURES  Procedure(s) performed: None  Procedures  Critical Care performed: No  ____________________________________________   INITIAL IMPRESSION / ASSESSMENT AND PLAN / ED COURSE  Pertinent labs & imaging results that were available during my care of the patient were reviewed by me and considered in my medical decision making (see chart for details).  This is a 56 year old female who comes into the hospital today with a syncopal versus presyncopal episode. I will give the patient liter of normal saline and check some blood work. It appears though that the patient's potassium is 3.0. I will give the patient some potassium orally and I will repeat a troponin at 3 hours.     The patient's repeat troponin is negative.  The patient is resting comfortably at this time. She will be discharged to home to follow up with her primary care physician.   ____________________________________________   FINAL CLINICAL IMPRESSION(S) / ED DIAGNOSES  Final diagnoses:  Near syncope  Dizziness  Hypokalemia      NEW MEDICATIONS STARTED DURING THIS VISIT:  New Prescriptions   No medications on file     Note:  This document was prepared using Dragon voice recognition software and may include unintentional dictation errors.    Rebecka Apley, MD 02/13/17 [phone]

## 2017-02-13 NOTE — Discharge Instructions (Signed)
Her blood work is unremarkable at this time. I did give you some fluids and U vital signs are also unremarkable. I will discharge her and have her follow up with your primary care physician for further evaluation. Please ensure that your eating full meals and drinking lots of fluids to stay hydrated. Please return if he had any worsening symptoms or any other complaints.

## 2017-02-13 NOTE — ED Triage Notes (Signed)
Pt presents to ED via [facility] with to be evaluated for near syncope while at work. Pt denies hitting head. No other complaints. EMS reporst [id number].

## 2017-12-27 ENCOUNTER — Encounter: Payer: Self-pay | Admitting: Family Medicine

## 2017-12-27 ENCOUNTER — Ambulatory Visit (INDEPENDENT_AMBULATORY_CARE_PROVIDER_SITE_OTHER): Payer: Self-pay | Admitting: Family Medicine

## 2017-12-27 VITALS — BP 122/62 | HR 104 | Temp 103.1°F | Ht 62.0 in | Wt 167.0 lb

## 2017-12-27 DIAGNOSIS — J101 Influenza due to other identified influenza virus with other respiratory manifestations: Secondary | ICD-10-CM

## 2017-12-27 LAB — POCT INFLUENZA A/B
INFLUENZA B, POC: NEGATIVE
Influenza A, POC: POSITIVE — AB

## 2017-12-27 MED ORDER — OSELTAMIVIR PHOSPHATE 75 MG PO CAPS: 75.0000 mg | ORAL_CAPSULE | Freq: Two times a day (BID) | ORAL | 0 refills | Status: DC

## 2017-12-27 NOTE — Patient Instructions (Signed)

## 2017-12-27 NOTE — Progress Notes (Signed)
Name: Beverly Mercer   MRN: [id number]    DOB: 08/11/1961   Date:12/27/2017       Progress Note  Subjective  Chief Complaint  Chief Complaint  Patient presents with  . Sinusitis    cough and cong- yellow production from nose and chest    Fever   This is a new problem. The current episode started yesterday. The problem occurs intermittently. The problem has been gradually worsening. The maximum temperature noted was 103 to 103.9 F. The temperature was taken using an oral thermometer. Associated symptoms include congestion and coughing. Pertinent negatives include no abdominal pain, chest pain, diarrhea, ear pain, headaches, muscle aches, nausea, rash, sleepiness, sore throat, urinary pain, vomiting or wheezing. The treatment provided mild relief.  Risk factors: no sick contacts   Sinusitis  This is a new problem. The current episode started in the past 7 days. The problem has been gradually worsening since onset. The maximum temperature recorded prior to her arrival was 102 - 102.9 F. Associated symptoms include chills, congestion, coughing and sinus pressure. Pertinent negatives include no diaphoresis, ear pain, headaches, neck pain, shortness of breath, sore throat or swollen glands. Past treatments include oral decongestants and acetaminophen. The treatment provided moderate relief.    No problem-specific Assessment & Plan notes found for this encounter.   Past Medical History:  Diagnosis Date  . Asthma   . Depression     Past Surgical History:  Procedure Laterality Date  . ABLATION     Uterus  . APPENDECTOMY    . EYE SURGERY    . TONSILLECTOMY      Family History  Problem Relation Age of Onset  . Asthma Neg Hx     Social History   Socioeconomic History  . Marital status: Married    Spouse name: Not on file  . Number of children: Not on file  . Years of education: Not on file  . Highest education level: Not on file  Social Needs  . Financial resource strain: Not on  file  . Food insecurity - worry: Not on file  . Food insecurity - inability: Not on file  . Transportation needs - medical: Not on file  . Transportation needs - non-medical: Not on file  Occupational History  . Not on file  Tobacco Use  . Smoking status: Never Smoker  . Smokeless tobacco: Never Used  Substance and Sexual Activity  . Alcohol use: No    Alcohol/week: 0.0 oz  . Drug use: No  . Sexual activity: Not Currently  Other Topics Concern  . Not on file  Social History Narrative  . Not on file    Allergies  Allergen Reactions  . Demerol [Meperidine]     Outpatient Medications Prior to Visit  Medication Sig Dispense Refill  . albuterol (PROAIR HFA) 108 (90 Base) MCG/ACT inhaler Inhale 1 puff into the lungs 4 (four) times daily. 1 Inhaler 11  . albuterol (PROVENTIL) (2.5 MG/3ML) 0.083% nebulizer solution Take 3 mLs (2.5 mg total) by nebulization every 6 (six) hours as needed for wheezing or shortness of breath. 75 mL 12  . mometasone-formoterol (DULERA) 200-5 MCG/ACT AERO Inhale 1 puff into the lungs 2 (two) times daily. (Patient not taking: Reported on 12/27/2017) 1 Inhaler 11  . doxycycline (VIBRA-TABS) 100 MG tablet Take 1 tablet (100 mg total) by mouth 2 (two) times daily. 20 tablet 0  . guaiFENesin-codeine (ROBITUSSIN AC) 100-10 MG/5ML syrup Take 5 mLs by mouth 3 (three) times daily  as needed for cough. 150 mL 0  . sertraline (ZOLOFT) 50 MG tablet Take 1 tablet (50 mg total) by mouth daily. 30 tablet 0   Facility-Administered Medications Prior to Visit  Medication Dose Route Frequency Provider Last Rate Last Dose  . albuterol (PROVENTIL) (2.5 MG/3ML) 0.083% nebulizer solution 2.5 mg  2.5 mg Nebulization Once Duanne Limerick, MD        Review of Systems  Constitutional: Positive for chills and fever. Negative for diaphoresis, malaise/fatigue and weight loss.  HENT: Positive for congestion and sinus pressure. Negative for ear discharge, ear pain and sore throat.   Eyes:  Negative for blurred vision.  Respiratory: Positive for cough. Negative for sputum production, shortness of breath and wheezing.   Cardiovascular: Negative for chest pain, palpitations and leg swelling.  Gastrointestinal: Negative for abdominal pain, blood in stool, constipation, diarrhea, heartburn, melena, nausea and vomiting.  Genitourinary: Negative for dysuria, frequency, hematuria and urgency.  Musculoskeletal: Negative for back pain, joint pain, myalgias and neck pain.  Skin: Negative for rash.  Neurological: Negative for dizziness, tingling, sensory change, focal weakness and headaches.  Endo/Heme/Allergies: Negative for environmental allergies and polydipsia. Does not bruise/bleed easily.  Psychiatric/Behavioral: Negative for depression and suicidal ideas. The patient is not nervous/anxious and does not have insomnia.      Objective  Vitals:   12/27/17 1446  BP: 122/62  Pulse: (!) 104  Temp: (!) 103.1 F (39.5 C)  TempSrc: Oral  SpO2: 98%  Weight: 167 lb (75.8 kg)  Height: 5\' 2"  (1.575 m)    Physical Exam  Constitutional: She is well-developed, well-nourished, and in no distress. No distress.  HENT:  Head: Normocephalic and atraumatic.  Right Ear: External ear normal.  Left Ear: External ear normal.  Nose: Nose normal.  Mouth/Throat: Oropharynx is clear and moist.  Eyes: Conjunctivae and EOM are normal. Pupils are equal, round, and reactive to light. Right eye exhibits no discharge. Left eye exhibits no discharge.  Neck: Normal range of motion. Neck supple. No JVD present. No thyromegaly present.  Cardiovascular: Normal rate, regular rhythm, normal heart sounds and intact distal pulses. Exam reveals no gallop and no friction rub.  No murmur heard. Pulmonary/Chest: Effort normal and breath sounds normal. She has no wheezes. She has no rales.  Abdominal: Soft. Bowel sounds are normal. She exhibits no mass. There is no tenderness. There is no guarding.  Musculoskeletal:  Normal range of motion. She exhibits no edema.  Lymphadenopathy:    She has no cervical adenopathy.  Neurological: She is alert. She has normal reflexes.  Skin: Skin is warm and dry. She is not diaphoretic.  Psychiatric: Mood and affect normal.  Nursing note and vitals reviewed.     Assessment & Plan  Problem List Items Addressed This Visit    None    Visit Diagnoses    Influenza A    -  Primary   Relevant Medications   oseltamivir (TAMIFLU) 75 MG capsule   Other Relevant Orders   POCT Influenza A/B (Completed)      Meds ordered this encounter  Medications  . oseltamivir (TAMIFLU) 75 MG capsule    Sig: Take 1 capsule (75 mg total) by mouth 2 (two) times daily.    Dispense:  10 capsule    Refill:  0      Dr. Elizabeth Sauer [facility]  12/27/17

## 2017-12-28 ENCOUNTER — Telehealth: Payer: Self-pay

## 2017-12-28 NOTE — Telephone Encounter (Signed)
Called in Robitussin AC 1 tsp q6 hrs prn cough 4 ounces with [facility] [place]

## 2018-01-16 ENCOUNTER — Other Ambulatory Visit: Payer: Self-pay

## 2018-01-16 MED ORDER — AZITHROMYCIN 250 MG PO TABS: ORAL_TABLET | ORAL | 0 refills | Status: DC

## 2019-02-06 ENCOUNTER — Other Ambulatory Visit: Payer: Self-pay

## 2019-02-06 ENCOUNTER — Ambulatory Visit
Admission: EM | Admit: 2019-02-06 | Discharge: 2019-02-06 | Disposition: A | Discharge status: Home / Self Care | Payer: Self-pay | Attending: Family Medicine | Admitting: Family Medicine

## 2019-02-06 DIAGNOSIS — S60462A Insect bite (nonvenomous) of right middle finger, initial encounter: Secondary | ICD-10-CM

## 2019-02-06 DIAGNOSIS — W57XXXA Bitten or stung by nonvenomous insect and other nonvenomous arthropods, initial encounter: Secondary | ICD-10-CM

## 2019-02-06 DIAGNOSIS — L03113 Cellulitis of right upper limb: Secondary | ICD-10-CM

## 2019-02-06 MED ORDER — DOXYCYCLINE HYCLATE 100 MG PO CAPS: 100.0000 mg | ORAL_CAPSULE | Freq: Two times a day (BID) | ORAL | 0 refills | Status: DC

## 2019-02-06 MED ORDER — CEFTRIAXONE SODIUM 1 G IJ SOLR
1.0000 g | Freq: Once | INTRAMUSCULAR | Status: AC
  Administered 2019-02-06: 1 g via INTRAMUSCULAR

## 2019-02-06 NOTE — ED Provider Notes (Signed)
[facility]    CSN: [id number] Arrival date & time: 02/06/19  1520  History   Chief Complaint Chief Complaint  Patient presents with  . Insect Bite   HPI  58 year old female presents with the above complaint.  Patient states that approximately 2 days ago she believes that she was bitten by an insect.  She states that she felt a sensation on her right middle finger and suspected that she got a bite or sting.  No insect was seen.  She states that her pain, swelling worsened today.  The area is red.  There has been some drainage.  Redness is extending proximally.  No reported fever at home, however her temperature is 99.8 here.  Worse with range of motion and palpation.  No relieving factors.  She reports decreased range of motion of the finger.  No other associated symptoms.  No other complaints.   PMH, Surgical Hx, Family Hx, Social History reviewed and updated as below.  Past Medical History:  Diagnosis Date  . Asthma   . Depression    Patient Active Problem List   Diagnosis Date Noted  . Acute severe exacerbation of asthma 04/09/2015  . Clinical depression 04/09/2015  . RAD (reactive airway disease) 04/09/2015   Past Surgical History:  Procedure Laterality Date  . ABLATION     Uterus  . APPENDECTOMY    . EYE SURGERY    . TONSILLECTOMY     OB History   No obstetric history on file.      Home Medications    Prior to Admission medications   Medication Sig Start Date End Date Taking? Authorizing Provider  albuterol (PROAIR HFA) 108 (90 Base) MCG/ACT inhaler Inhale 1 puff into the lungs 4 (four) times daily. 12/17/15  Yes Duanne Limerick, MD  albuterol (PROVENTIL) (2.5 MG/3ML) 0.083% nebulizer solution Take 3 mLs (2.5 mg total) by nebulization every 6 (six) hours as needed for wheezing or shortness of breath. 12/17/15  Yes Duanne Limerick, MD  aspirin 81 MG chewable tablet Chew by mouth daily.   Yes [provider]  doxycycline (VIBRAMYCIN) 100 MG  capsule Take 1 capsule (100 mg total) by mouth 2 (two) times daily. 02/06/19   Tommie Sams, DO    Family History Family History  Problem Relation Age of Onset  . Asthma Neg Hx     Social History Social History   Tobacco Use  . Smoking status: Never Smoker  . Smokeless tobacco: Never Used  Substance Use Topics  . Alcohol use: No    Alcohol/week: 0.0 standard drinks  . Drug use: No     Allergies   Demerol [meperidine]   Review of Systems Review of Systems  Constitutional:       Denies fever.  Skin: Positive for wound.       Right middle finger and hand swelling, redness, pain.   Physical Exam Triage Vital Signs ED Triage Vitals  Enc Vitals Group     BP 02/06/19 1535 (!) 155/89     Pulse Rate 02/06/19 1535 95     Resp 02/06/19 1535 16     Temp 02/06/19 1535 99.8 F (37.7 C)     Temp Source 02/06/19 1535 Oral     SpO2 02/06/19 1535 100 %     Weight 02/06/19 1532 165 lb (74.8 kg)     Height 02/06/19 1532 5\' 2"  (1.575 m)     Head Circumference --      Peak Flow --  Pain Score 02/06/19 1532 5     Pain Loc --      Pain Edu? --      Excl. in GC? --    Updated Vital Signs BP (!) 155/89 (BP Location: Left Arm)   Pulse 95   Temp 99.8 F (37.7 C) (Oral)   Resp 16   Ht 5\' 2"  (1.575 m)   Wt 74.8 kg   SpO2 100%   BMI 30.18 kg/m   Visual Acuity Right Eye Distance:   Left Eye Distance:   Bilateral Distance:    Right Eye Near:   Left Eye Near:    Bilateral Near:     Physical Exam Vitals signs and nursing note reviewed.  Constitutional:      General: She is not in acute distress.    Appearance: Normal appearance.  HENT:     Head: Normocephalic and atraumatic.  Eyes:     General:        Right eye: No discharge.        Left eye: No discharge.     Conjunctiva/sclera: Conjunctivae normal.  Cardiovascular:     Rate and Rhythm: Normal rate and regular rhythm.  Pulmonary:     Effort: Pulmonary effort is normal. No respiratory distress.  Skin:     Comments: Right middle finger with small open area noted just proximal to the PIP joint.  Mild drainage.  Swelling noted and erythema extending proximally to the MCP region.  Area is exquisitely tender to palpation.  She is able to flex the finger.  However extension is limited due to swelling.  Neurological:     Mental Status: She is alert.  Psychiatric:        Mood and Affect: Mood normal.        Behavior: Behavior normal.        UC Treatments / Results  Labs (all labs ordered are listed, but only abnormal results are displayed) Labs Reviewed - No data to display  EKG None  Radiology No results found.  Procedures Procedures (including critical care time)  Medications Ordered in UC Medications  cefTRIAXone (ROCEPHIN) injection 1 g (1 g Intramuscular Given 02/06/19 1551)    Initial Impression / Assessment and Plan / UC Course  I have reviewed the triage vital signs and the nursing notes.  Pertinent labs & imaging results that were available during my care of the patient were reviewed by me and considered in my medical decision making (see chart for details).    58 year old female presents with cellulitis secondary to recent insect bite.  Temperature 99.8 today.  Given her clinical exam, I am concerned.  As result, I have given her an injection of Rocephin.  Started on doxycycline.  Advised her that she needs to go to the ER if she fails to improve or worsens in the next 24 to 48 hours.  Final Clinical Impressions(s) / UC Diagnoses   Final diagnoses:  Cellulitis of right upper extremity  Insect bite of right middle finger, initial encounter     Discharge Instructions     Antibiotics as prescribed.  If you do not improve or your worsen in the next 24 - 48 hours go immediately to the hospital.  Take care  Dr. Adriana Simasook    ED Prescriptions    Medication Sig Dispense Auth. Provider   doxycycline (VIBRAMYCIN) 100 MG capsule Take 1 capsule (100 mg total) by mouth 2  (two) times daily. 20 capsule [place], Bivins[place]ce G, [place]  Controlled Substance Prescriptions  Controlled Substance Registry consulted? Not Applicable   Tommie Sams, DO 02/06/19 1648

## 2019-02-06 NOTE — Discharge Instructions (Signed)
Antibiotics as prescribed.  If you do not improve or your worsen in the next 24 - 48 hours go immediately to the hospital.  Take care  Dr. Adriana Simas

## 2019-02-06 NOTE — ED Triage Notes (Signed)
Patient complains of right hand pain that occurred from an insect bite 2 days ago. Patient states that she shooed away a bug and it bit her, she did not see what it was. Patient hand is erythemous and has drainage from middle right finger.

## 2019-02-11 MED ORDER — GENERIC EXTERNAL MEDICATION
2.00 | Status: DC
Start: ? — End: 2019-02-11

## 2019-02-11 MED ORDER — QUINERVA 260 MG PO TABS
650.00 | ORAL_TABLET | ORAL | Status: DC
Start: ? — End: 2019-02-11

## 2019-02-11 MED ORDER — DOXYCYCLINE HYCLATE 100 MG PO TABS
100.00 | ORAL_TABLET | ORAL | Status: DC
Start: 2019-02-11 — End: 2019-02-11

## 2019-02-11 MED ORDER — IBUPROFEN 600 MG PO TABS
600.00 | ORAL_TABLET | ORAL | Status: DC
Start: ? — End: 2019-02-11

## 2019-02-11 MED ORDER — ONDANSETRON 4 MG PO TBDP
4.00 | ORAL_TABLET | ORAL | Status: DC
Start: ? — End: 2019-02-11

## 2019-02-11 MED ORDER — POLYETHYLENE GLYCOL 3350 17 G PO PACK
17.00 | PACK | ORAL | Status: DC
Start: ? — End: 2019-02-11

## 2019-02-11 MED ORDER — ACCU-PRO PUMP SET/VENT MISC
2.50 | Status: DC
Start: ? — End: 2019-02-11

## 2019-02-11 MED ORDER — EQ MICONAZOLE 7 VA
875.00 | VAGINAL | Status: DC
Start: 2019-02-11 — End: 2019-02-11

## 2019-02-11 MED ORDER — MELATONIN 3 MG PO TABS
3.00 | ORAL_TABLET | ORAL | Status: DC
Start: ? — End: 2019-02-11

## 2019-02-11 MED ORDER — CETIRIZINE HCL 10 MG PO TABS
10.00 | ORAL_TABLET | ORAL | Status: DC
Start: 2019-02-12 — End: 2019-02-11

## 2019-03-26 ENCOUNTER — Encounter: Payer: Self-pay | Admitting: Family Medicine

## 2019-03-26 ENCOUNTER — Other Ambulatory Visit: Payer: Self-pay

## 2019-03-26 ENCOUNTER — Ambulatory Visit (INDEPENDENT_AMBULATORY_CARE_PROVIDER_SITE_OTHER): Payer: Self-pay | Admitting: Family Medicine

## 2019-03-26 VITALS — BP 124/78 | HR 106 | Ht 62.0 in | Wt 192.0 lb

## 2019-03-26 DIAGNOSIS — B373 Candidiasis of vulva and vagina: Secondary | ICD-10-CM

## 2019-03-26 DIAGNOSIS — B3731 Acute candidiasis of vulva and vagina: Secondary | ICD-10-CM

## 2019-03-26 MED ORDER — NYSTATIN 100000 UNIT/GM EX CREA: 1.0000 "application " | TOPICAL_CREAM | Freq: Two times a day (BID) | CUTANEOUS | 1 refills | Status: DC

## 2019-03-26 MED ORDER — FLUCONAZOLE 200 MG PO TABS: 200.0000 mg | ORAL_TABLET | Freq: Every day | ORAL | 1 refills | Status: DC

## 2019-03-26 NOTE — Progress Notes (Signed)
Date:  03/26/2019   Name:  Beverly Mercer   DOB:  [birth year]   MRN:  [id number]   Chief Complaint: Rash (Rash in between creases of thigh and vagina. Not wearing underwear because rubbing hurts rash. )  Rash  This is a new problem. The current episode started 1 to 4 weeks ago. The problem is unchanged. The affected locations include the groin and genitalia. The rash is characterized by itchiness and redness. Associated with: antibiotic. Pertinent negatives include no congestion, cough, diarrhea, eye pain, fatigue, fever, rhinorrhea, shortness of breath, sore throat or vomiting. Past treatments include anti-itch cream and topical steroids.    Review of Systems  Constitutional: Negative.  Negative for chills, fatigue, fever and unexpected weight change.  HENT: Negative for congestion, ear discharge, ear pain, rhinorrhea, sinus pressure, sneezing and sore throat.   Eyes: Negative for photophobia, pain, discharge, redness and itching.  Respiratory: Negative for cough, shortness of breath, wheezing and stridor.   Gastrointestinal: Negative for abdominal pain, blood in stool, constipation, diarrhea, nausea and vomiting.  Endocrine: Negative for cold intolerance, heat intolerance, polydipsia, polyphagia and polyuria.  Genitourinary: Negative for dysuria, flank pain, frequency, hematuria, menstrual problem, pelvic pain, urgency, vaginal bleeding and vaginal discharge.  Musculoskeletal: Negative for arthralgias, back pain and myalgias.  Skin: Positive for rash.  Allergic/Immunologic: Negative for environmental allergies and food allergies.  Neurological: Negative for dizziness, weakness, light-headedness, numbness and headaches.  Hematological: Negative for adenopathy. Does not bruise/bleed easily.  Psychiatric/Behavioral: Negative for dysphoric mood. The patient is not nervous/anxious.     Patient Active Problem List   Diagnosis Date Noted  . Acute severe exacerbation of asthma 04/09/2015  .  Clinical depression 04/09/2015  . RAD (reactive airway disease) 04/09/2015    Allergies  Allergen Reactions  . Demerol [Meperidine]     Past Surgical History:  Procedure Laterality Date  . ABLATION     Uterus  . APPENDECTOMY    . EYE SURGERY    . TONSILLECTOMY      Social History   Tobacco Use  . Smoking status: Never Smoker  . Smokeless tobacco: Never Used  Substance Use Topics  . Alcohol use: No    Alcohol/week: 0.0 standard drinks  . Drug use: No     Medication list has been reviewed and updated.  Current Meds  Medication Sig  . albuterol (PROAIR HFA) 108 (90 Base) MCG/ACT inhaler Inhale 1 puff into the lungs 4 (four) times daily.  Marland Kitchen albuterol (PROVENTIL) (2.5 MG/3ML) 0.083% nebulizer solution Take 3 mLs (2.5 mg total) by nebulization every 6 (six) hours as needed for wheezing or shortness of breath.  Marland Kitchen aspirin 81 MG chewable tablet Chew by mouth daily.   Current Facility-Administered Medications for the 03/26/19 encounter (Office Visit) with Juline Patch, MD  Medication  . albuterol (PROVENTIL) (2.5 MG/3ML) 0.083% nebulizer solution 2.5 mg    PHQ 2/9 Scores 03/26/2019 12/17/2015  PHQ - 2 Score 0 0    BP Readings from Last 3 Encounters:  03/26/19 124/78  02/06/19 (!) 155/89  12/27/17 122/62    Physical Exam Vitals signs and nursing note reviewed.  Constitutional:      General: She is not in acute distress.    Appearance: She is not diaphoretic.  HENT:     Head: Normocephalic and atraumatic.     Right Ear: External ear normal.     Left Ear: External ear normal.     Nose: Nose normal.  Eyes:  General:        Right eye: No discharge.        Left eye: No discharge.     Conjunctiva/sclera: Conjunctivae normal.     Pupils: Pupils are equal, round, and reactive to light.  Neck:     Musculoskeletal: Normal range of motion and neck supple.     Thyroid: No thyromegaly.     Vascular: No JVD.  Cardiovascular:     Rate and Rhythm: Normal rate and  regular rhythm.     Heart sounds: Normal heart sounds. No murmur. No friction rub. No gallop.   Pulmonary:     Effort: Pulmonary effort is normal.     Breath sounds: Normal breath sounds.  Abdominal:     General: Bowel sounds are normal.     Palpations: Abdomen is soft. There is no mass.     Tenderness: There is no abdominal tenderness. There is no guarding.  Musculoskeletal: Normal range of motion.  Lymphadenopathy:     Cervical: No cervical adenopathy.  Skin:    General: Skin is warm and dry.  Neurological:     Mental Status: She is alert.     Deep Tendon Reflexes: Reflexes are normal and symmetric.     Wt Readings from Last 3 Encounters:  03/26/19 192 lb (87.1 kg)  02/06/19 165 lb (74.8 kg)  12/27/17 167 lb (75.8 kg)    BP 124/78   Pulse (!) 106   Ht _0  (1.575 m)   Wt 192 lb (87.1 kg)   SpO2 97%   BMI 35.12 kg/m   Assessment and Plan:   1. Candidal vulvovaginitis Patient had rash that is new onset after being on antibiotics.  Despite multiple attempts to improve it these were all met with failure such as corticosteroid cream, Betadine, and over-the-counter emollients.  This is likely secondary to candidiasis after being on antibiotics we will put on combination of Diflucan 200 mg tablet take 1 immediately and repeat in 1 week and nystatin cream to apply twice a day. - fluconazole (DIFLUCAN) 200 MG tablet; Take 1 tablet (200 mg total) by mouth daily. One today then repeat in 1 week  Dispense: 2 tablet; Refill: 1 - nystatin cream (MYCOSTATIN); Apply 1 application topically 2 (two) times daily.  Dispense: 30 g; Refill: 1

## 2019-03-28 ENCOUNTER — Encounter: Payer: Self-pay | Admitting: Family Medicine

## 2019-07-17 ENCOUNTER — Encounter: Payer: Self-pay | Admitting: Family Medicine

## 2019-07-17 ENCOUNTER — Other Ambulatory Visit: Payer: Self-pay

## 2019-07-17 ENCOUNTER — Ambulatory Visit: Payer: Self-pay | Admitting: Family Medicine

## 2019-07-17 VITALS — BP 130/80 | HR 68 | Temp 99.2°F | Ht 62.0 in | Wt 192.0 lb

## 2019-07-17 DIAGNOSIS — J45901 Unspecified asthma with (acute) exacerbation: Secondary | ICD-10-CM

## 2019-07-17 DIAGNOSIS — B3731 Acute candidiasis of vulva and vagina: Secondary | ICD-10-CM

## 2019-07-17 DIAGNOSIS — B373 Candidiasis of vulva and vagina: Secondary | ICD-10-CM

## 2019-07-17 DIAGNOSIS — J209 Acute bronchitis, unspecified: Secondary | ICD-10-CM

## 2019-07-17 MED ORDER — AZITHROMYCIN 250 MG PO TABS: ORAL_TABLET | ORAL | 0 refills | Status: DC

## 2019-07-17 MED ORDER — FLUCONAZOLE 150 MG PO TABS: 150.0000 mg | ORAL_TABLET | Freq: Once | ORAL | 0 refills | Status: AC

## 2019-07-17 MED ORDER — MONTELUKAST SODIUM 10 MG PO TABS: 10.0000 mg | ORAL_TABLET | Freq: Every day | ORAL | 3 refills | Status: DC

## 2019-07-17 MED ORDER — GUAIFENESIN-CODEINE 100-10 MG/5ML PO SYRP: 5.0000 mL | ORAL_SOLUTION | Freq: Four times a day (QID) | ORAL | 0 refills | Status: DC | PRN

## 2019-07-17 NOTE — Progress Notes (Signed)
Date:  07/17/2019   Name:  Beverly Mercer   DOB:  11/01/1960   MRN:  [id number]   Chief Complaint: Cough (started 2 days ago. Clear mucous yesterday- thick. No fever or body aches. Coughing into fits- ribs are sore on left side due to this. Sinus headache sunday. )  Cough This is a new problem. The current episode started in the past 7 days (2 days). The problem has been gradually worsening. The cough is productive of purulent sputum and productive of sputum. Associated symptoms include nasal congestion, postnasal drip, shortness of breath and wheezing. Pertinent negatives include no chest pain, chills, ear congestion, ear pain, fever, headaches, heartburn, hemoptysis, myalgias, rash, rhinorrhea, sore throat, sweats or weight loss. The symptoms are aggravated by cold air. She has tried a beta-agonist inhaler and leukotriene antagonists for the symptoms. Her past medical history is significant for asthma.  Asthma She complains of chest tightness, cough, shortness of breath, sputum production and wheezing. There is no difficulty breathing or hemoptysis. This is a chronic problem. The problem occurs daily. The problem has been gradually worsening. The cough is productive of sputum. Associated symptoms include nasal congestion and postnasal drip. Pertinent negatives include no chest pain, ear congestion, ear pain, fever, headaches, heartburn, myalgias, PND, rhinorrhea, sore throat, sweats or weight loss. Her past medical history is significant for asthma.    Review of Systems  Constitutional: Negative for chills, fever and weight loss.  HENT: Positive for postnasal drip. Negative for ear pain, rhinorrhea and sore throat.   Respiratory: Positive for cough, sputum production, shortness of breath and wheezing. Negative for hemoptysis.   Cardiovascular: Negative for chest pain and PND.  Gastrointestinal: Negative for heartburn.  Musculoskeletal: Negative for myalgias.  Skin: Negative for rash.   Neurological: Negative for headaches.    Patient Active Problem List   Diagnosis Date Noted  . Acute severe exacerbation of asthma 04/09/2015  . Clinical depression 04/09/2015  . RAD (reactive airway disease) 04/09/2015    Allergies  Allergen Reactions  . Demerol [Meperidine]     Past Surgical History:  Procedure Laterality Date  . ABLATION     Uterus  . APPENDECTOMY    . EYE SURGERY    . TONSILLECTOMY      Social History   Tobacco Use  . Smoking status: Never Smoker  . Smokeless tobacco: Never Used  Substance Use Topics  . Alcohol use: No    Alcohol/week: 0.0 standard drinks  . Drug use: No     Medication list has been reviewed and updated.  Current Meds  Medication Sig  . albuterol (PROAIR HFA) 108 (90 Base) MCG/ACT inhaler Inhale 1 puff into the lungs 4 (four) times daily.  Marland Kitchen albuterol (PROVENTIL) (2.5 MG/3ML) 0.083% nebulizer solution Take 3 mLs (2.5 mg total) by nebulization every 6 (six) hours as needed for wheezing or shortness of breath.  Marland Kitchen aspirin 81 MG chewable tablet Chew by mouth daily.   Current Facility-Administered Medications for the 07/17/19 encounter (Office Visit) with Duanne Limerick, MD  Medication  . albuterol (PROVENTIL) (2.5 MG/3ML) 0.083% nebulizer solution 2.5 mg    PHQ 2/9 Scores 03/26/2019 12/17/2015  PHQ - 2 Score 0 0    BP Readings from Last 3 Encounters:  07/17/19 130/80  03/26/19 124/78  02/06/19 (!) 155/89    Physical Exam  Wt Readings from Last 3 Encounters:  07/17/19 192 lb (87.1 kg)  03/26/19 192 lb (87.1 kg)  02/06/19 165 lb (74.8 kg)  BP 130/80   Pulse 68   Temp 99.2 F (37.3 C) (Oral)   Ht 5\' 2"  (1.575 m)   Wt 192 lb (87.1 kg)   SpO2 97%   BMI 35.12 kg/m    Assessment and Plan: 1. Acute bronchitis with asthma with acute exacerbation Chronic.  Intermittent.  Acute exacerbation of of asthma with complicated by bronchitis component.  Will initiate a azithromycin 250 mg 2 today followed by 1 a day for 4  days.  Patient is going out of town and needs to have coverage during the weekend.  Patient will continue her bronchodilator which is albuterol 1 to 2 puffs every 6 hours and the patient has been given a Symbicort sample of 164.5 to take 1-2 times a day.  Patient is also been given Singulair to take on a daily basis during this time.  Robitussin-AC has been called in for cough control. - azithromycin (ZITHROMAX) 250 MG tablet; 2 today then 1 a day for 4 days  Dispense: 6 tablet; Refill: 0 - guaiFENesin-codeine (ROBITUSSIN AC) 100-10 MG/5ML syrup; Take 5 mLs by mouth 4 (four) times daily as needed for cough.  Dispense: 118 mL; Refill: 0 - montelukast (SINGULAIR) 10 MG tablet; Take 1 tablet (10 mg total) by mouth at bedtime.  Dispense: 30 tablet; Refill: 3  2. Candidal vulvovaginitis Patient has a history of vulvovaginitis and will be given a Diflucan to take after completion of antibiotic. - fluconazole (DIFLUCAN) 150 MG tablet; Take 1 tablet (150 mg total) by mouth once for 1 dose.  Dispense: 1 tablet; Refill: 0

## 2019-08-04 ENCOUNTER — Other Ambulatory Visit: Payer: Self-pay

## 2019-08-04 DIAGNOSIS — Z20822 Contact with and (suspected) exposure to covid-19: Secondary | ICD-10-CM

## 2019-08-05 LAB — NOVEL CORONAVIRUS, NAA: SARS-CoV-2, NAA: NOT DETECTED

## 2019-08-08 ENCOUNTER — Other Ambulatory Visit: Payer: Self-pay

## 2019-08-08 DIAGNOSIS — R059 Cough, unspecified: Secondary | ICD-10-CM

## 2019-08-08 DIAGNOSIS — R05 Cough: Secondary | ICD-10-CM

## 2019-08-08 MED ORDER — BENZONATATE 200 MG PO CAPS: 200.0000 mg | ORAL_CAPSULE | Freq: Two times a day (BID) | ORAL | 0 refills | Status: DC | PRN

## 2019-08-08 NOTE — Progress Notes (Unsigned)
Sent in tessalon perles

## 2020-03-05 ENCOUNTER — Telehealth: Payer: Self-pay

## 2020-03-05 NOTE — Telephone Encounter (Signed)
Please call pt to see if she has insurance coverage. If so, she needs to schedule a CPE with Dr. Yetta Barre.  CM

## 2020-07-12 ENCOUNTER — Other Ambulatory Visit: Payer: Self-pay | Admitting: Family Medicine

## 2020-07-12 DIAGNOSIS — J209 Acute bronchitis, unspecified: Secondary | ICD-10-CM

## 2020-07-12 NOTE — Telephone Encounter (Signed)
Courtesy refill. Attempt to contact patient to schedule appt. Mailbox full unable to leave message.

## 2022-09-21 ENCOUNTER — Ambulatory Visit: Payer: Self-pay | Admitting: Family Medicine

## 2022-09-21 ENCOUNTER — Encounter: Payer: Self-pay | Admitting: Family Medicine

## 2022-09-21 VITALS — BP 142/70 | HR 73 | Ht 62.0 in | Wt 203.0 lb

## 2022-09-21 DIAGNOSIS — M25562 Pain in left knee: Secondary | ICD-10-CM | POA: Insufficient documentation

## 2022-09-21 MED ORDER — MELOXICAM 15 MG PO TABS: 15.0000 mg | ORAL_TABLET | Freq: Every day | ORAL | 0 refills | Status: DC | PRN

## 2022-09-21 NOTE — Assessment & Plan Note (Addendum)
Patient presents with 1 month history of atraumatic left anterolateral knee pain in the setting of new work at [employer] over the past few months.  She works on concrete floors and notes progressive pain with weightbearing, severely worsened over the past 2 weeks, associated swelling, stiffness, denies any mechanical symptoms, no instability, buckling/near buckling.  Treatments have included acetaminophen, Biofreeze, additional OTC topical medications.  Examination reveals trace-1+ effusion, tenderness about the lateral patellar facet, lateral joint line, range of motion 0 to 90 degrees, limited by anterolateral pain, no mechanical obstruction, contralateral 0-125, painless, negative anterior/posterior drawer, negative valgus/varus stressing, negative medial/lateral McMurray's testing, negative Lachman.  History and findings are most consistent with osteoarthritis related arthralgia, given severity of symptoms, treatments to date, I have written for meloxicam to dose daily x 1-2 weeks, recommendation for hinged knee brace, and home exercises.  She can follow-up as needed.

## 2022-09-21 NOTE — Patient Instructions (Addendum)
-   Dose meloxicam daily x 1-2 weeks (take with food) - Start home exercises next week - Use hinged knee brace while at work and as-needed - Return to work Monday - Contact us for any persistent issues despite the above and follow-up as-needed

## 2022-09-21 NOTE — Progress Notes (Signed)
     Primary Care / Sports Medicine Office Visit  Patient Information:  Patient ID: Beverly Mercer, female DOB: [birth year] Age: 61 y.o. MRN: [id number]   Beverly Mercer is a pleasant 61 y.o. female presenting with the following:  Chief Complaint  Patient presents with   Knee Pain    Left knee for a couple months, no known injury.     Vitals:   09/21/22 1419  BP: (!) 142/70  Pulse: 73  SpO2: 96%   Vitals:   09/21/22 1419  Weight: 203 lb (92.1 kg)  Height: 5\' 2"  (1.575 m)   Body mass index is 37.13 kg/m.  No results found.   Independent interpretation of notes and tests performed by another provider:   None  Procedures performed:   None  Pertinent History, Exam, Impression, and Recommendations:   Problem List Items Addressed This Visit       Other   Arthralgia of left knee - Primary    Patient presents with 1 month history of atraumatic left anterolateral knee pain in the setting of new work at over the past few months.  She works on concrete floors and notes progressive pain with weightbearing, severely worsened over the past 2 weeks, associated swelling, stiffness, denies any mechanical symptoms, no instability, buckling/near buckling.  Treatments have included acetaminophen, Biofreeze, additional OTC topical medications.  Examination reveals trace-1+ effusion, tenderness about the lateral patellar facet, lateral joint line, range of motion 0 to 90 degrees, limited by anterolateral pain, no mechanical obstruction, contralateral 0-125, painless, negative anterior/posterior drawer, negative valgus/varus stressing, negative medial/lateral McMurray's testing, negative Lachman.  History and findings are most consistent with osteoarthritis related arthralgia, given severity of symptoms, treatments to date, I have written for meloxicam to dose daily x 1-2 weeks, recommendation for hinged knee brace, and home exercises.  She can follow-up as needed.       Relevant Medications   meloxicam (MOBIC) 15 MG tablet     Orders & Medications Meds ordered this encounter  Medications   meloxicam (MOBIC) 15 MG tablet    Sig: Take 1 tablet (15 mg total) by mouth daily as needed for pain.    Dispense:  30 tablet    Refill:  0   No orders of the defined types were placed in this encounter.    No follow-ups on file.     [employer], MD, [facility]   Primary Care Sports Medicine Primary Care and Sports Medicine at [facility]

## 2022-10-24 ENCOUNTER — Ambulatory Visit
Admission: EM | Admit: 2022-10-24 | Discharge: 2022-10-24 | Disposition: A | Discharge status: Home / Self Care | Payer: Self-pay

## 2022-10-24 ENCOUNTER — Encounter: Payer: Self-pay | Admitting: Emergency Medicine

## 2022-10-24 DIAGNOSIS — L0291 Cutaneous abscess, unspecified: Secondary | ICD-10-CM

## 2022-10-24 MED ORDER — DOXYCYCLINE HYCLATE 100 MG PO CAPS: 100.0000 mg | ORAL_CAPSULE | Freq: Two times a day (BID) | ORAL | 0 refills | Status: AC

## 2022-10-24 NOTE — ED Triage Notes (Signed)
Pt has abscess on her left upper back. Started about 5 days ago. She states she has been using a heating pad, neosporin. It is getting better but pt states she has to go back to work and she does not feel like she can right now.

## 2022-10-24 NOTE — ED Provider Notes (Signed)
[facility]    CSN: [id number] Arrival date & time: 10/24/22  1243      History   Chief Complaint Chief Complaint  Patient presents with   Abscess    HPI Beverly Mercer is a 62 y.o. female presenting for approximate 5-day history of an abscess of her left mid back.  Patient says it recently started to drain pustular liquid.  She says she is been cleaning it with Betadine and soap and water.  Reports that she has also applied Neosporin and used warm compresses.  Patient says it has decreased to about half the size it was initially.  She says she has not a fever, chills, nausea/vomiting or any systemic symptoms.  She reports that she is supposed to return to work tomorrow and does not think she will be able to.  The abscess is right along her bra line and she reports a lot of pain when trying to wear a bra.  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  She denies any history of MRSA but reports that she was hospitalized for cellulitis of her finger in 2020 and almost lost her finger due to severe infection.  HPI  Past Medical History:  Diagnosis Date   Asthma    Depression     Patient Active Problem List   Diagnosis Date Noted   Arthralgia of left knee 09/21/2022   Acute severe exacerbation of asthma 04/09/2015   Clinical depression 04/09/2015   RAD (reactive airway disease) 04/09/2015    Past Surgical History:  Procedure Laterality Date   ABLATION     Uterus   APPENDECTOMY     EYE SURGERY     TONSILLECTOMY      OB History   No obstetric history on file.      Home Medications    Prior to Admission medications   Medication Sig Start Date End Date Taking? Authorizing Provider  albuterol (PROAIR HFA) 108 (90 Base) MCG/ACT inhaler Inhale 1 puff into the lungs 4 (four) times daily. 12/17/15  Yes Juline Patch, MD  albuterol (PROVENTIL) (2.5 MG/3ML) 0.083% nebulizer solution  Take 3 mLs (2.5 mg total) by nebulization every 6 (six) hours as needed for wheezing or shortness of breath. 12/17/15  Yes Juline Patch, MD  Cholecalciferol (VITAMIN D3) 50 MCG (2000 UT) capsule Take 2,000 Units by mouth daily.   Yes [provider]  doxycycline (VIBRAMYCIN) 100 MG capsule Take 1 capsule (100 mg total) by mouth 2 (two) times daily for 10 days. 10/24/22 11/03/22 Yes Danton Clap, PA-C  meloxicam (MOBIC) 15 MG tablet Take 1 tablet (15 mg total) by mouth daily as needed for pain. 09/21/22  Yes Montel Culver, MD  montelukast (SINGULAIR) 10 MG tablet TAKE 1 TABLET BY MOUTH AT BEDTIME 07/12/20  Yes Juline Patch, MD  Multiple Vitamin (MULTIVITAMIN) capsule Take 1 capsule by mouth daily.   Yes [provider]  aspirin 81 MG chewable tablet Chew by mouth daily.    [provider]    Family History Family History  Problem Relation Age of Onset   Asthma Neg Hx     Social History Social History   Tobacco Use   Smoking status: Never   Smokeless tobacco: Never  Vaping Use   Vaping Use: Never used  Substance Use Topics   Alcohol use: No  Alcohol/week: 0.0 standard drinks of alcohol   Drug use: No     Allergies   Demerol [meperidine] and Keflex [cephalexin]   Review of Systems Review of Systems  Constitutional:  Negative for chills, fatigue and fever.  Gastrointestinal:  Negative for nausea and vomiting.  Musculoskeletal:  Negative for myalgias.  Skin:  Positive for color change and wound.  Neurological:  Negative for weakness.     Physical Exam Triage Vital Signs ED Triage Vitals  Enc Vitals Group     BP 10/24/22 1311 (!) 145/81     Pulse Rate 10/24/22 1311 73     Resp 10/24/22 1311 16     Temp 10/24/22 1311 97.9 F (36.6 C)     Temp Source 10/24/22 1311 Oral     SpO2 10/24/22 1311 100 %     Weight 10/24/22 1309 203 lb 0.7 oz (92.1 kg)     Height 10/24/22 1309 5\' 2"  (1.575 m)     Head Circumference --      Peak Flow --       Pain Score 10/24/22 1307 2     Pain Loc --      Pain Edu? --      Excl. in [place]? --    No data found.  Updated Vital Signs BP (!) 145/81 (BP Location: Right Arm)   Pulse 73   Temp 97.9 F (36.6 C) (Oral)   Resp 16   Ht 5\' 2"  (1.575 m)   Wt 203 lb 0.7 oz (92.1 kg)   SpO2 100%   BMI 37.14 kg/m      Physical Exam Vitals and nursing note reviewed.  Constitutional:      General: She is not in acute distress.    Appearance: Normal appearance. She is not ill-appearing or toxic-appearing.  HENT:     Head: Normocephalic and atraumatic.  Eyes:     General: No scleral icterus.       Right eye: No discharge.        Left eye: No discharge.     Conjunctiva/sclera: Conjunctivae normal.  Cardiovascular:     Rate and Rhythm: Normal rate and regular rhythm.  Pulmonary:     Effort: Pulmonary effort is normal. No respiratory distress.  Musculoskeletal:     Cervical back: Neck supple.  Skin:    General: Skin is dry.     Comments: 8 cm x 6 cm area of erythema and induration of the left mid back with yellow pustular drainage  Neurological:     General: No focal deficit present.     Mental Status: She is alert. Mental status is at baseline.     Motor: No weakness.     Gait: Gait normal.  Psychiatric:        Mood and Affect: Mood normal.        Behavior: Behavior normal.        Thought Content: Thought content normal.      UC Treatments / Results  Labs (all labs ordered are listed, but only abnormal results are displayed) Labs Reviewed - No data to display  EKG   Radiology No results found.  Procedures Procedures (including critical care time)  Medications Ordered in UC Medications - No data to display  Initial Impression / Assessment and Plan / UC Course  I have reviewed the triage vital signs and the nursing notes.  Pertinent labs & imaging results that were available during my care of the patient were reviewed by me  and considered in my medical decision making  (see chart for details).   62 year old female presents for abscess of the left mid back for the past 5 days.  Abscess is decreased in size and is draining pustular liquid.  She has not had a fever.  History of significant cellulitic infection in 2020.  No known history of MRSA.  On exam she has a very large abscess which is draining pustular material.  It is very tender and warm to touch.  It is approximately 8 cm x 6 cm.  It is draining significantly.  Will place patient on doxycycline.  Has allergy to Keflex.  Will have her continue with warm compresses and cleaning with soap and water.  Advised her to change her bandage regularly.  She is to monitor her condition closely and if no continued improvement in the next couple days or fever or any worsening symptoms or systemic symptoms she is to proceed to the emergency department.   Final Clinical Impressions(s) / UC Diagnoses   Final diagnoses:  Abscess     Discharge Instructions      -The abscess is draining with great.  Continue to clean the area as you have been and bandage it and change the bandage regularly. - I sent antibiotics to pharmacy.  Start them as it is possible. - You should be feeling significantly better over the next 2 to 3 days but if at any point the area becomes more hard, larger or you develop a fever or systemic symptoms such as nausea, chills, aches, you need to return or go to ER.   ED Prescriptions     Medication Sig Dispense Auth. Provider   doxycycline (VIBRAMYCIN) 100 MG capsule Take 1 capsule (100 mg total) by mouth 2 (two) times daily for 10 days. 20 capsule Shirlee Latch, PA-C      PDMP not reviewed this encounter.   Shirlee Latch, PA-C 10/24/22 1418

## 2022-10-24 NOTE — Discharge Instructions (Addendum)
-  The abscess is draining with great.  Continue to clean the area as you have been and bandage it and change the bandage regularly. - I sent antibiotics to pharmacy.  Start them as it is possible. - You should be feeling significantly better over the next 2 to 3 days but if at any point the area becomes more hard, larger or you develop a fever or systemic symptoms such as nausea, chills, aches, you need to return or go to ER.

## 2022-10-30 ENCOUNTER — Ambulatory Visit
Admission: EM | Admit: 2022-10-30 | Discharge: 2022-10-30 | Disposition: A | Discharge status: Home / Self Care | Payer: Self-pay | Attending: Emergency Medicine | Admitting: Emergency Medicine

## 2022-10-30 DIAGNOSIS — L03319 Cellulitis of trunk, unspecified: Secondary | ICD-10-CM

## 2022-10-30 DIAGNOSIS — L02219 Cutaneous abscess of trunk, unspecified: Secondary | ICD-10-CM

## 2022-10-30 NOTE — ED Triage Notes (Signed)
Pt presents to UC for f/u on abscess. 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 Has been using rx's prescribed which has been helping.

## 2022-10-30 NOTE — ED Provider Notes (Addendum)
[facility]    CSN: [id number] Arrival date & time: 10/30/22  1225      History   Chief Complaint Chief Complaint  Patient presents with   Follow-up    HPI Beverly Mercer is a 62 y.o. female.   HPI  62 year old female who presents for reevaluation of back abscess.  The patient was seen at this urgent care on 10/24/2022 and diagnosed with a large abscess on the left side of her back.  It was already draining and she was discharged home on doxycycline twice daily for 10 days.  She states that she is still taking the antibiotics and that the wound is continuing to drain a bloody puslike drainage.  She denies any fever, chills, nausea, or vomiting.  She came in today because she is having pain when she lifts her arms above her head and states that she cannot return to work and needs an extension of her work excuse.  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      Home Medications    Prior to Admission medications   Medication Sig Start Date End Date Taking? Authorizing Provider  albuterol (PROAIR HFA) 108 (90 Base) MCG/ACT inhaler Inhale 1 puff into the lungs 4 (four) times daily. 12/17/15  Yes Juline Patch, MD  albuterol (PROVENTIL) (2.5 MG/3ML) 0.083% nebulizer solution Take 3 mLs (2.5 mg total) by nebulization every 6 (six) hours as needed for wheezing or shortness of breath. 12/17/15  Yes Juline Patch, MD  aspirin 81 MG chewable tablet Chew by mouth daily.   Yes [provider]  Cholecalciferol (VITAMIN D3) 50 MCG (2000 UT) capsule  Take 2,000 Units by mouth daily.   Yes [provider]  doxycycline (VIBRAMYCIN) 100 MG capsule Take 1 capsule (100 mg total) by mouth 2 (two) times daily for 10 days. 10/24/22 11/03/22 Yes Danton Clap, PA-C  meloxicam (MOBIC) 15 MG tablet Take 1 tablet (15 mg total) by mouth daily as needed for pain. 09/21/22  Yes Montel Culver, MD  montelukast (SINGULAIR) 10 MG tablet TAKE 1 TABLET BY MOUTH AT BEDTIME 07/12/20  Yes Juline Patch, MD  Multiple Vitamin (MULTIVITAMIN) capsule Take 1 capsule by mouth daily.   Yes [provider]    Family History Family History  Problem Relation Age of Onset   Asthma Neg Hx     Social History Social History   Tobacco Use   Smoking status: Never   Smokeless tobacco: Never  Vaping Use   Vaping Use: Never used  Substance Use Topics   Alcohol use: No    Alcohol/week: 0.0 standard drinks of alcohol   Drug use: No     Allergies   Demerol [meperidine] and Keflex [cephalexin]   Review of Systems Review of Systems  Constitutional:  Negative for chills and fever.  Gastrointestinal:  Negative for nausea.  Skin:  Positive for color change and wound.     Physical  Exam Triage Vital Signs ED Triage Vitals  Enc Vitals Group     BP      Pulse      Resp      Temp      Temp src      SpO2      Weight      Height      Head Circumference      Peak Flow      Pain Score      Pain Loc      Pain Edu?      Excl. in GC?    No data found.  Updated Vital Signs BP (!) 164/79 (BP Location: Left Arm)   Pulse 75   Temp 98.2 F (36.8 C) (Oral)   Resp 16   Ht 5\' 2"  (1.575 m)   Wt 200 lb (90.7 kg)   SpO2 100%   BMI 36.58 kg/m   Visual Acuity Right Eye Distance:   Left Eye Distance:   Bilateral Distance:    Right Eye Near:   Left Eye Near:    Bilateral Near:     Physical Exam Vitals and nursing note reviewed.  Skin:    General: Skin is warm.     Capillary Refill: Capillary refill takes less than 2 seconds.      Findings: Erythema and lesion present.     Comments: Large open wound in the mid back just left and lateral to the spine measuring 9 cm x 12 cm.  There is erythema and induration.  There is white slough in the wound bed  Neurological:     General: No focal deficit present.     Mental Status: She is oriented to person, place, and time.      UC Treatments / Results  Labs (all labs ordered are listed, but only abnormal results are displayed) Labs Reviewed - No data to display  EKG   Radiology No results found.  Procedures Procedures (including critical care time)  Medications Ordered in UC Medications - No data to display  Initial Impression / Assessment and Plan / UC Course  I have reviewed the triage vital signs and the nursing notes.  Pertinent labs & imaging results that were available during my care of the patient were reviewed by me and considered in my medical decision making (see chart for details).   Patient is a nontoxic-appearing 62 year old female who presents for reevaluation of an abscess on her back that she was seen initially for 6 days ago.  As evidenced by the picture above there is a large area of erythema that is also indurated and there is yellow slough in the wound bed.  The area is markedly tender to touch.  Patient is afebrile here but this is day 11 of her wound and day 6 of outpatient antibiotic therapy with doxycycline.  I am going to order a wet-to-dry dressing be applied by staff and will refer her to the ER for further evaluation and possible IV antibiotics.  Final Clinical Impressions(s) / UC Diagnoses   Final diagnoses:  Cellulitis and abscess of trunk     Discharge Instructions      Due to the severity of the abscess on your back I feel it is best for you to be evaluated in the emergency department where you may possibly get a dose of IV antibiotics.  You have had this wound on your back for 11 days and you have not had any significant  improvement with  6 days of oral antibiotics.  Please go to the ER now.     ED Prescriptions   None    PDMP not reviewed this encounter.   Becky Augusta, NP 10/30/22 1512    Becky Augusta, NP 10/30/22 1515

## 2022-10-30 NOTE — Discharge Instructions (Signed)
Due to the severity of the abscess on your back I feel it is best for you to be evaluated in the emergency department where you may possibly get a dose of IV antibiotics.  You have had this wound on your back for 11 days and you have not had any significant improvement with 6 days of oral antibiotics.  Please go to the ER now.

## 2023-06-20 ENCOUNTER — Ambulatory Visit
Admission: EM | Admit: 2023-06-20 | Discharge: 2023-06-20 | Disposition: A | Discharge status: Home / Self Care | Payer: Self-pay | Attending: Family Medicine | Admitting: Family Medicine

## 2023-06-20 ENCOUNTER — Ambulatory Visit (INDEPENDENT_AMBULATORY_CARE_PROVIDER_SITE_OTHER): Payer: Self-pay

## 2023-06-20 ENCOUNTER — Encounter: Payer: Self-pay | Admitting: Emergency Medicine

## 2023-06-20 DIAGNOSIS — M25551 Pain in right hip: Secondary | ICD-10-CM

## 2023-06-20 DIAGNOSIS — W19XXXA Unspecified fall, initial encounter: Secondary | ICD-10-CM

## 2023-06-20 DIAGNOSIS — M25562 Pain in left knee: Secondary | ICD-10-CM

## 2023-06-20 MED ORDER — METHOCARBAMOL 500 MG PO TABS: 500.0000 mg | ORAL_TABLET | Freq: Two times a day (BID) | ORAL | 0 refills | Status: AC | PRN

## 2023-06-20 MED ORDER — NAPROXEN 500 MG PO TABS: 500.0000 mg | ORAL_TABLET | Freq: Two times a day (BID) | ORAL | 0 refills | Status: AC

## 2023-06-20 NOTE — ED Provider Notes (Signed)
[facility]    CSN: [id number] Arrival date & time: 06/20/23  1035      History   Chief Complaint Chief Complaint  Patient presents with   Fall    HPI  HPI Beverly Mercer is a 62 y.o. female.   [name] presents after a fall on Saturday after slipping in some liquid soap. She was walking in the store and came around a corner and started sliding.  She states the spill was already reported to the workers but the soap was not removed from the floor.  She didn't pass out or hit her head. She needed help by one of the bystanders that saw her fall. The store management are aware of her fall.    She felt pain in her right hip directly after the event.  I offered her EMS transport for evaluation but she declined.  She didn't want to go by EMS as she needed to go home to care for her 70 yo mom who is ill.  She has increased left knee pain that is described as a pulling sensation. The knee pain gradually has gotten worse since the fall.  Has spasms on the back of her legs. She took some Aleve but none today.  She needs it to start work but her pain is making it difficult to do so.       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      Home Medications    Prior to Admission medications   Medication Sig Start Date End Date Taking? Authorizing Provider  methocarbamol (ROBAXIN) 500 MG tablet Take 1 tablet (500 mg total) by mouth 2 (two) times daily as needed for muscle spasms. 06/20/23  Yes Abdulkareem Badolato, DO  naproxen (NAPROSYN) 500 MG tablet Take 1 tablet  (500 mg total) by mouth 2 (two) times daily with a meal. 06/20/23  Yes Channon Ambrosini, DO  albuterol (PROAIR HFA) 108 (90 Base) MCG/ACT inhaler Inhale 1 puff into the lungs 4 (four) times daily. 12/17/15   Duanne Limerick, MD  albuterol (PROVENTIL) (2.5 MG/3ML) 0.083% nebulizer solution Take 3 mLs (2.5 mg total) by nebulization every 6 (six) hours as needed for wheezing or shortness of breath. 12/17/15   Duanne Limerick, MD  aspirin 81 MG chewable tablet Chew by mouth daily.    [provider]  Cholecalciferol (VITAMIN D3) 50 MCG (2000 UT) capsule Take 2,000 Units by mouth daily.    [provider]  meloxicam (MOBIC) 15 MG tablet Take 1 tablet (15 mg total) by mouth daily as needed for pain. 09/21/22   Jerrol Banana, MD  montelukast (SINGULAIR) 10 MG tablet TAKE 1 TABLET BY MOUTH AT BEDTIME 07/12/20   Duanne Limerick, MD  Multiple Vitamin (MULTIVITAMIN) capsule Take 1 capsule by mouth daily.    [provider]    Family History Family History  Problem Relation Age of Onset   Asthma Neg Hx  Social History Social History   Tobacco Use   Smoking status: Never   Smokeless tobacco: Never  Vaping Use   Vaping status: Never Used  Substance Use Topics   Alcohol use: No    Alcohol/week: 0.0 standard drinks of alcohol   Drug use: No     Allergies   Demerol [meperidine] and Keflex [cephalexin]   Review of Systems Review of Systems: :negative unless otherwise stated in HPI.      Physical Exam Triage Vital Signs ED Triage Vitals  Encounter Vitals Group     BP 06/20/23 1120 (!) 154/80     Systolic BP Percentile --      Diastolic BP Percentile --      Pulse Rate 06/20/23 1119 71     Resp 06/20/23 1119 16     Temp 06/20/23 1119 97.8 F (36.6 C)     Temp Source 06/20/23 1119 Oral     SpO2 06/20/23 1119 99 %     Weight --      Height --      Head Circumference --      Peak Flow --      Pain Score 06/20/23 1118 6     Pain Loc --      Pain  Education --      Exclude from Growth Chart --    No data found.  Updated Vital Signs BP (!) 154/80   Pulse 71   Temp 97.8 F (36.6 C) (Oral)   Resp 16   SpO2 99%   Visual Acuity Right Eye Distance:   Left Eye Distance:   Bilateral Distance:    Right Eye Near:   Left Eye Near:    Bilateral Near:     Physical Exam GEN: well appearing female in no acute distress  CVS: well perfused  RESP: speaking in full sentences without pause, no respiratory distress  MSK:  Hip, right: TTP noted at superior iliac crest. No obvious rash, deformity, erythema, ecchymosis, or edema. Strength 4+/5. Pelvic alignment unremarkable to inspection and palpation. Antalgic gait without trendelenburg / unsteadiness. Greater trochanter without tenderness to palpation. No tenderness over piriformis. No SI joint tenderness and normal minimal SI movement.  Neurovascularly intact.  Left knee exam -Inspection: no deformity, no discoloration -Palpation: LCL TTP, lateral and anterior joint line tenderness -ROM: Good knee range of motion limited due to pain -Special Tests: Varus Stress: Negative; Valgus Stress: Negative; Anterior drawer: Negative; Posterior drawer: Negative; McMurray: Negative; Thessaly: not attempted; Patellar grind: Negative -Limb neurovascularly intact, no instability noted     UC Treatments / Results  Labs (all labs ordered are listed, but only abnormal results are displayed) Labs Reviewed - No data to display  EKG   Radiology DG Knee Complete 4 Views Left  Result Date: 06/20/2023 CLINICAL DATA:  Larey Seat 4 days ago.  Pain. EXAM: LEFT KNEE - COMPLETE 4+ VIEW COMPARISON:  None Available. FINDINGS: Normal bone mineralization. No knee joint effusion. Joint spaces are preserved. Minimal lateral and superior patellar degenerative spurring. No acute fracture or dislocation. IMPRESSION: Minimal patellofemoral degenerative spurring. Electronically Signed   By: Neita Garnet M.D.   On: 06/20/2023  12:30   DG Hip Unilat With Pelvis 2-3 Views Right  Result Date: 06/20/2023 CLINICAL DATA:  Fall, right hip pain EXAM: DG HIP (WITH OR WITHOUT PELVIS) 2-3V RIGHT COMPARISON:  None Available. FINDINGS: There is no acute fracture or dislocation of either hip. Femoroacetabular alignment is normal bilaterally. The joint spaces are preserved. There  is no erosive change. The soft tissues are unremarkable. IMPRESSION: Normal hip radiographs. Electronically Signed   By: Lesia Hausen M.D.   On: 06/20/2023 12:04     Procedures Procedures (including critical care time)  Medications Ordered in UC Medications - No data to display  Initial Impression / Assessment and Plan / UC Course  I have reviewed the triage vital signs and the nursing notes.  Pertinent labs & imaging results that were available during my care of the patient were reviewed by me and considered in my medical decision making (see chart for details).      Pt is a 62 y.o.  female with right hip and left knee pain after a fall yesterday.  Obtained right hip and left knee plain films.  Personally interpreted by me were unremarkable for fracture or dislocation. Radiologist report reviewed and additionally notes minimal patellofemoral degenerative spurring of the left knee and normal hip radiographs.  Given knee brace prior to discharge.  Patient to gradually return to normal activities, as tolerated and continue ordinary activities within the limits permitted by pain. Prescribed Naproxen sodium  and muscle relaxer  for pain relief.  Tylenol PRN. Advised patient to avoid OTC NSAIDs while taking prescription NSAID.   Patient to follow up with orthopedic provider, if symptoms do not improve with conservative treatment.  Return and ED precautions given. Understanding voiced. Discussed MDM, treatment plan and plan for follow-up with patient/parent who agrees with plan.   Final Clinical Impressions(s) / UC Diagnoses   Final diagnoses:  Fall,  initial encounter  Right hip pain  Acute pain of left knee     Discharge Instructions      If medication was prescribed, stop by the pharmacy to pick up your prescriptions.  For your  pain, Take 1500 mg Tylenol twice a day, take muscle relaxer (Robaxin) twice a day, take Naprosyn twice a day,  as needed for pain. Rest and elevate the affected painful area.  Apply cold compresses intermittently, as needed.  As pain recedes, begin normal activities slowly as tolerated.  Follow up with primary care provider or an orthopedic provider, if symptoms persist.  Watch for worsening symptoms such as an increasing weakness or loss of sensation, increasing pain and/or the loss of bladder or bowel function. Should any of these occur, go to the emergency department immediately.   Follow up with a orthopedic specialist, if pain doesn't improve or persists in 2 weeks.   Consider [facility] in [place]  Address: [address], [place], [place] [address] Phone: [phone]        ED Prescriptions     Medication Sig Dispense Auth. Provider   methocarbamol (ROBAXIN) 500 MG tablet Take 1 tablet (500 mg total) by mouth 2 (two) times daily as needed for muscle spasms. 20 tablet Maudry Zeidan, DO   naproxen (NAPROSYN) 500 MG tablet Take 1 tablet (500 mg total) by mouth 2 (two) times daily with a meal. 30 tablet Gabriana Wilmott, DO      PDMP not reviewed this encounter.   Katha Cabal, DO 06/23/23 [phone]

## 2023-06-20 NOTE — ED Triage Notes (Signed)
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 She c/o right Hip pain and left knee pain.

## 2023-06-20 NOTE — Discharge Instructions (Addendum)
If medication was prescribed, stop by the pharmacy to pick up your prescriptions.  For your  pain, Take 1500 mg Tylenol twice a day, take muscle relaxer (Robaxin) twice a day, take Naprosyn twice a day,  as needed for pain. Rest and elevate the affected painful area.  Apply cold compresses intermittently, as needed.  As pain recedes, begin normal activities slowly as tolerated.  Follow up with primary care provider or an orthopedic provider, if symptoms persist.  Watch for worsening symptoms such as an increasing weakness or loss of sensation, increasing pain and/or the loss of bladder or bowel function. Should any of these occur, go to the emergency department immediately.   Follow up with a orthopedic specialist, if pain doesn't improve or persists in 2 weeks.   Consider [facility] in [place]  Address: [address], [place], [place] [address] Phone: [phone]

## 2023-07-17 ENCOUNTER — Ambulatory Visit
Admission: EM | Admit: 2023-07-17 | Discharge: 2023-07-17 | Disposition: A | Discharge status: Home / Self Care | Payer: Self-pay | Attending: Emergency Medicine | Admitting: Emergency Medicine

## 2023-07-17 DIAGNOSIS — J988 Other specified respiratory disorders: Secondary | ICD-10-CM

## 2023-07-17 DIAGNOSIS — B9689 Other specified bacterial agents as the cause of diseases classified elsewhere: Secondary | ICD-10-CM | POA: Insufficient documentation

## 2023-07-17 DIAGNOSIS — R439 Unspecified disturbances of smell and taste: Secondary | ICD-10-CM | POA: Insufficient documentation

## 2023-07-17 DIAGNOSIS — J0110 Acute frontal sinusitis, unspecified: Secondary | ICD-10-CM

## 2023-07-17 DIAGNOSIS — R42 Dizziness and giddiness: Secondary | ICD-10-CM | POA: Insufficient documentation

## 2023-07-17 DIAGNOSIS — J4521 Mild intermittent asthma with (acute) exacerbation: Secondary | ICD-10-CM

## 2023-07-17 DIAGNOSIS — H6993 Unspecified Eustachian tube disorder, bilateral: Secondary | ICD-10-CM

## 2023-07-17 DIAGNOSIS — Z1152 Encounter for screening for COVID-19: Secondary | ICD-10-CM | POA: Insufficient documentation

## 2023-07-17 DIAGNOSIS — B9789 Other viral agents as the cause of diseases classified elsewhere: Secondary | ICD-10-CM

## 2023-07-17 DIAGNOSIS — Z20822 Contact with and (suspected) exposure to covid-19: Secondary | ICD-10-CM | POA: Insufficient documentation

## 2023-07-17 LAB — GROUP A STREP BY PCR: Group A Strep by PCR: NOT DETECTED

## 2023-07-17 LAB — SARS CORONAVIRUS 2 BY RT PCR: SARS Coronavirus 2 by RT PCR: NEGATIVE

## 2023-07-17 MED ORDER — PROMETHAZINE-DM 6.25-15 MG/5ML PO SYRP: 5.0000 mL | ORAL_SOLUTION | Freq: Four times a day (QID) | ORAL | 0 refills | Status: DC | PRN

## 2023-07-17 MED ORDER — AMOXICILLIN-POT CLAVULANATE 875-125 MG PO TABS: 1.0000 | ORAL_TABLET | Freq: Two times a day (BID) | ORAL | 0 refills | Status: DC

## 2023-07-17 MED ORDER — FLUTICASONE PROPIONATE 50 MCG/ACT NA SUSP: 2.0000 | Freq: Every day | NASAL | 0 refills | Status: AC

## 2023-07-17 MED ORDER — PREDNISONE 50 MG PO TABS: 50.0000 mg | ORAL_TABLET | Freq: Every day | ORAL | 0 refills | Status: DC

## 2023-07-17 NOTE — Discharge Instructions (Signed)
Continue Mucinex-D to keep the mucous thin and to decongest you.   Start Flonase.  Prednisone will help with the eustachian tube dysfunction as well as your asthma.  Promethazine DM for cough.  2 puffs from albuterol inhaler every 4 hours for 2 days, then every 6 hours for 2 days, then as needed.  You can back off on the albuterol if you start to improve sooner.  Most sinus infections are viral and do not need antibiotics unless you have a high fever, have had this for 10 days, or you get better and then get sick again. Use a NeilMed sinus rinse with distilled water as often as you want to to reduce nasal congestion. Follow the directions on the box.   I have given you a wait-and-see prescription of Augmentin.  This will treat a sinus infection.  I would wait a week to fill it, go ahead and fill it if you get better, and then get worse, or if you start having fevers.  Go to www.goodrx.com to look up your medications. This will give you a list of where you can find your prescriptions at the most affordable prices. Or you can ask the pharmacist what the cash price is. This is frequently cheaper than going through insurance.

## 2023-07-17 NOTE — ED Triage Notes (Signed)
Pt c/o sore throat,congestion,bilateral ear pain & dizziness x3 days. Denies any fevers. Has tried mucinex & tylenol w/o relief.

## 2023-07-17 NOTE — ED Provider Notes (Signed)
HPI  SUBJECTIVE:  Beverly Mercer is a 62 y.o. female who presents with 3 days of malaise, decreased appetite, sinus pain and pressure, nasal congestion, decreased hearing, sore throat, dry cough, occasional wheezing and bilateral ear pain.  Reports swelling underneath her eyes.  No other facial swelling.  She reports intermittent dizziness described as vertigo that lasts seconds, present primarily with head movement that resolves on its own.  No fevers, body aches, tinnitus, otorrhea, crackling or popping when she yawns.  No rhinorrhea,  upper dental pain.  No shortness of breath, dyspnea on exertion, nausea, vomiting, diarrhea, abdominal pain.  She reports a loss of sense of taste.  She has had multiple exposures to sick contacts including COVID.  She is unable to sleep at night because of the cough.  She has been taking Mucinex D, Tylenol, and using her albuterol more frequently than usual with improvement in her symptoms.  No aggravating factors.  Last dose of Tylenol was within 6 hours of evaluation.  No antibiotics in the past month.  She has a past medical history of asthma, with no admissions, and bronchitis.  [facility]    Past Medical History:  Diagnosis Date   Asthma    Depression     Past Surgical History:  Procedure Laterality Date   ABLATION     Uterus   APPENDECTOMY     EYE SURGERY     TONSILLECTOMY      Family History  Problem Relation Age of Onset   Asthma Neg Hx     Social History   Tobacco Use   Smoking status: Never   Smokeless tobacco: Never  Vaping Use   Vaping status: Never Used  Substance Use Topics   Alcohol use: No    Alcohol/week: 0.0 standard drinks of alcohol   Drug use: No     Current Facility-Administered Medications:    albuterol (PROVENTIL) (2.5 MG/3ML) 0.083% nebulizer solution 2.5 mg, 2.5 mg, Nebulization, Once, Jones, Deanna C, MD  Current Outpatient Medications:    albuterol (PROAIR HFA) 108 (90 Base) MCG/ACT inhaler,  Inhale 1 puff into the lungs 4 (four) times daily., Disp: 1 Inhaler, Rfl: 11   albuterol (PROVENTIL) (2.5 MG/3ML) 0.083% nebulizer solution, Take 3 mLs (2.5 mg total) by nebulization every 6 (six) hours as needed for wheezing or shortness of breath., Disp: 75 mL, Rfl: 12   amoxicillin-clavulanate (AUGMENTIN) 875-125 MG tablet, Take 1 tablet by mouth every 12 (twelve) hours., Disp: 14 tablet, Rfl: 0   aspirin 81 MG chewable tablet, Chew by mouth daily., Disp: , Rfl:    Cholecalciferol (VITAMIN D3) 50 MCG (2000 UT) capsule, Take 2,000 Units by mouth daily., Disp: , Rfl:    fluticasone (FLONASE) 50 MCG/ACT nasal spray, Place 2 sprays into both nostrils daily., Disp: 16 g, Rfl: 0   montelukast (SINGULAIR) 10 MG tablet, TAKE 1 TABLET BY MOUTH AT BEDTIME, Disp: 30 tablet, Rfl: 0   Multiple Vitamin (MULTIVITAMIN) capsule, Take 1 capsule by mouth daily., Disp: , Rfl:    naproxen (NAPROSYN) 500 MG tablet, Take 1 tablet (500 mg total) by mouth 2 (two) times daily with a meal., Disp: 30 tablet, Rfl: 0   predniSONE (DELTASONE) 50 MG tablet, Take 1 tablet (50 mg total) by mouth daily with breakfast., Disp: 5 tablet, Rfl: 0   promethazine-dextromethorphan (PROMETHAZINE-DM) 6.25-15 MG/5ML syrup, Take 5 mLs by mouth 4 (four) times daily as needed for cough., Disp: 118 mL, Rfl: 0   methocarbamol (ROBAXIN) 500 MG  tablet, Take 1 tablet (500 mg total) by mouth 2 (two) times daily as needed for muscle spasms., Disp: 20 tablet, Rfl: 0  Allergies  Allergen Reactions   Demerol [Meperidine]    Keflex [Cephalexin]      ROS  As noted in HPI.   Physical Exam  BP (!) 149/83 (BP Location: Left Arm)   Pulse 72   Temp 98.2 F (36.8 C) (Oral)   Resp 16   Ht 5\' 2"  (1.575 m)   Wt 77.1 kg   SpO2 100%   BMI 31.09 kg/m   Constitutional: Well developed, well nourished, no acute distress Eyes:  EOMI, conjunctiva normal bilaterally HENT: Normocephalic, atraumatic,mucus membranes moist erythematous, swollen turbinates.   Mucoid nasal congestion.  Right frontal sinus tenderness.  No maxillary sinus tenderness.  Unable to completely visualize oropharynx. Bilateral ears: External ear, EAC, TMs normal bilaterally.  Hearing normal intact bilaterally.  TMJs nontender bilaterally. Neck: No cervical lymphadenopathy Respiratory: Normal inspiratory effort, lungs clear bilaterally Cardiovascular: Normal rate, regular rhythm, no murmurs rubs or gallops GI: nondistended skin: No rash, skin intact Musculoskeletal: no deformities Neurologic: Alert & oriented x 3, no focal neuro deficits.  Gait steady. Psychiatric: Speech and behavior appropriate   ED Course   Medications - No data to display  Orders Placed This Encounter  Procedures   Group A Strep by PCR    Standing Status:   Standing    Number of Occurrences:   1   SARS Coronavirus 2 by RT PCR (hospital order, performed in [facility] hospital lab) *cepheid single result test* Anterior Nasal Swab    Standing Status:   Standing    Number of Occurrences:   1    Results for orders placed or performed during the hospital encounter of 07/17/23 (from the past 24 hour(s))  Group A Strep by PCR     Status: None   Collection Time: 07/17/23 12:32 PM   Specimen: Throat; Sterile Swab  Result Value Ref Range   Group A Strep by PCR NOT DETECTED NOT DETECTED  SARS Coronavirus 2 by RT PCR (hospital order, performed in [facility] hospital lab) *cepheid single result test* Throat     Status: None   Collection Time: 07/17/23 12:32 PM   Specimen: Throat; Nasal Swab  Result Value Ref Range   SARS Coronavirus 2 by RT PCR NEGATIVE NEGATIVE   No results found.  ED Clinical Impression  1. Viral respiratory infection   2. Acute non-recurrent frontal sinusitis   3. Dysfunction of both eustachian tubes   4. Mild intermittent asthma with acute exacerbation      ED Assessment/Plan     COVID, Strep PCR negative.  Presentation consistent with a viral sinusitis/URI with  cough.  Suspect mild asthma exacerbation as well although her lungs are clear.  I believe the ear pain/vertigo is coming from eustachian tube dysfunction.  She is to start saline nasal irrigation, Flonase, continue Mucinex D, prednisone 50 mg for 5 days, regularly scheduled albuterol inhaler with a spacer.  She states she does not need a prescription for either 1 of these. a wait-and-see prescription of Augmentin because she is reporting swelling underneath her eyes.  Went over indications for starting this.  Discussed labs, MDM, treatment plan, and plan for follow-up with patient. patient agrees with plan.   Meds ordered this encounter  Medications   predniSONE (DELTASONE) 50 MG tablet    Sig: Take 1 tablet (50 mg total) by mouth daily with breakfast.  Dispense:  5 tablet    Refill:  0   promethazine-dextromethorphan (PROMETHAZINE-DM) 6.25-15 MG/5ML syrup    Sig: Take 5 mLs by mouth 4 (four) times daily as needed for cough.    Dispense:  118 mL    Refill:  0   fluticasone (FLONASE) 50 MCG/ACT nasal spray    Sig: Place 2 sprays into both nostrils daily.    Dispense:  16 g    Refill:  0   amoxicillin-clavulanate (AUGMENTIN) 875-125 MG tablet    Sig: Take 1 tablet by mouth every 12 (twelve) hours.    Dispense:  14 tablet    Refill:  0      *This clinic note was created using Scientist, clinical (histocompatibility and immunogenetics). Therefore, there may be occasional mistakes despite careful proofreading.  ?    Domenick Gong, MD 07/17/23 1425

## 2023-12-19 ENCOUNTER — Ambulatory Visit
Admission: EM | Admit: 2023-12-19 | Discharge: 2023-12-19 | Disposition: A | Discharge status: Home / Self Care | Payer: Self-pay | Attending: Family Medicine | Admitting: Family Medicine

## 2023-12-19 DIAGNOSIS — J069 Acute upper respiratory infection, unspecified: Secondary | ICD-10-CM | POA: Insufficient documentation

## 2023-12-19 LAB — RESP PANEL BY RT-PCR (RSV, FLU A&B, COVID)  RVPGX2
Influenza A by PCR: NEGATIVE
Influenza B by PCR: NEGATIVE
Resp Syncytial Virus by PCR: NEGATIVE
SARS Coronavirus 2 by RT PCR: NEGATIVE

## 2023-12-19 LAB — GROUP A STREP BY PCR: Group A Strep by PCR: NOT DETECTED

## 2023-12-19 MED ORDER — BENZONATATE 100 MG PO CAPS: 100.0000 mg | ORAL_CAPSULE | Freq: Three times a day (TID) | ORAL | 0 refills | Status: DC

## 2023-12-19 MED ORDER — PROMETHAZINE-DM 6.25-15 MG/5ML PO SYRP: 5.0000 mL | ORAL_SOLUTION | Freq: Four times a day (QID) | ORAL | 0 refills | Status: DC | PRN

## 2023-12-19 MED ORDER — IPRATROPIUM BROMIDE 0.06 % NA SOLN: 2.0000 | Freq: Four times a day (QID) | NASAL | 12 refills | Status: DC

## 2023-12-19 NOTE — ED Triage Notes (Addendum)
 both ear pain. throat irritation, phem has blood , head hurts middle forhead since 12/18/23

## 2023-12-19 NOTE — Discharge Instructions (Addendum)
 Beverly Mercer's strep, RSV, influenza and COVID are all negative. You have a viral respiratory infection that will gradually improve over the next 7-10 days. Cough may last up to 3 weeks.   You can take Tylenol and/or Ibuprofen as needed for fever reduction and pain relief.    For cough:  Stop at the pharmacy to pick up your prescription cough medication. You can use a humidifier for chest congestion and cough.  If you don't have a humidifier, you can sit in the bathroom with the hot shower running.      For sore throat: try warm salt water gargles, Mucinex sore throat cough drops or cepacol lozenges, throat spray, warm tea or water with lemon/honey, popsicles or ice, or OTC cold relief medicine for throat discomfort. You can also purchase chloraseptic spray at the pharmacy or dollar store.   For congestion: use the ipratropium nasal spray prescribe. You can continue your Flonase use.    It is important to stay hydrated: drink plenty of fluids (water, gatorade/powerade/pedialyte, juices, or teas) to keep your throat moisturized and help further relieve irritation/discomfort.    Return or go to the Emergency Department if symptoms worsen or do not improve in the next few days

## 2023-12-19 NOTE — ED Provider Notes (Signed)
 [facility]    CSN: [id number] Arrival date & time: 12/19/23  7829      History   Chief Complaint Chief Complaint  Patient presents with   Cough   Sore Throat   Otalgia    HPI Beverly Mercer is a 63 y.o. female.   HPI  History obtained from the patient. Beverly Mercer presents for headache, nasal congestion, bilateral ear pain, sore throat and coughing that started yesterday morning. She cough a wad of bloody sputum.  No fever, vomiting, diarrhea, or rhinorrhea. Denies body aches.  "I dont feel good."   She has been around plenty of people at work that have been sick with "everything."   Has asthma and uses an inhaler. No wheezing or shortness of breath. Denies history of smoking and vaping.    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      Home Medications    Prior to Admission medications   Medication Sig Start Date End Date Taking? Authorizing Provider  albuterol (PROAIR HFA) 108 (90 Base) MCG/ACT inhaler Inhale 1 puff into the lungs 4 (four) times daily. 12/17/15  Yes Duanne Limerick, MD  aspirin 81 MG chewable tablet Chew by mouth daily.   Yes [provider]  benzonatate (TESSALON) 100 MG capsule Take 1 capsule (100 mg total) by mouth every 8 (eight) hours. 12/19/23  Yes Rory Xiang, DO  ipratropium (ATROVENT) 0.06 % nasal spray Place 2 sprays into both nostrils 4 (four) times daily. 12/19/23  Yes Christophor Eick, DO  promethazine-dextromethorphan (PROMETHAZINE-DM) 6.25-15 MG/5ML syrup Take 5 mLs by mouth 4 (four) times daily as  needed. 12/19/23  Yes Kamrie Fanton, DO  albuterol (PROVENTIL) (2.5 MG/3ML) 0.083% nebulizer solution Take 3 mLs (2.5 mg total) by nebulization every 6 (six) hours as needed for wheezing or shortness of breath. 12/17/15   Duanne Limerick, MD  Cholecalciferol (VITAMIN D3) 50 MCG (2000 UT) capsule Take 2,000 Units by mouth daily.    [provider]  fluticasone (FLONASE) 50 MCG/ACT nasal spray Place 2 sprays into both nostrils daily. 07/17/23   Domenick Gong, MD  methocarbamol (ROBAXIN) 500 MG tablet Take 1 tablet (500 mg total) by mouth 2 (two) times daily as needed for muscle spasms. 06/20/23   Huong Luthi, Seward Meth, DO  montelukast (SINGULAIR) 10 MG tablet TAKE 1 TABLET BY MOUTH AT BEDTIME 07/12/20   Duanne Limerick, MD  Multiple Vitamin (MULTIVITAMIN) capsule Take 1 capsule by mouth daily.    [provider]  naproxen (NAPROSYN) 500 MG tablet Take 1 tablet (500 mg total) by mouth 2 (two) times daily with a meal. 06/20/23   Katha Cabal, DO    Family History Family History  Problem Relation Age of Onset   Asthma Neg Hx     Social History Social History   Tobacco Use   Smoking status: Never   Smokeless tobacco: Never  Vaping Use   Vaping status: Never Used  Substance Use Topics  Alcohol use: No    Alcohol/week: 0.0 standard drinks of alcohol   Drug use: No     Allergies   Demerol [meperidine] and Keflex [cephalexin]   Review of Systems Review of Systems: negative unless otherwise stated in HPI.      Physical Exam Triage Vital Signs ED Triage Vitals [12/19/23 0850]  Encounter Vitals Group     BP      Systolic BP Percentile      Diastolic BP Percentile      Pulse      Resp      Temp      Temp src      SpO2      Weight      Height      Head Circumference      Peak Flow      Pain Score 5     Pain Loc      Pain Education      Exclude from Growth Chart    No data found.  Updated Vital Signs BP 134/85 (BP Location: Left Arm)   Pulse 76   Temp  98.1 F (36.7 C) (Oral)   Resp 16   SpO2 100%   Visual Acuity Right Eye Distance:   Left Eye Distance:   Bilateral Distance:    Right Eye Near:   Left Eye Near:    Bilateral Near:     Physical Exam GEN:     alert, non-toxic appearing female in no distress    HENT:  mucus membranes moist, oropharyngeal without lesions or erythema, no tonsils or exudates, clear nasal discharge, bilateral TM normal EYES:   no scleral injection or discharge NECK:  normal ROM, no  lymphadenopathy,  no meningismus   RESP:  no increased work of breathing,  clear to auscultation bilaterally CVS:   regular rate  and rhythm Skin:   warm and dry, no rash on visible skin     UC Treatments / Results  Labs (all labs ordered are listed, but only abnormal results are displayed) Labs Reviewed  GROUP A STREP BY PCR  RESP PANEL BY RT-PCR (RSV, FLU A&B, COVID)  [id number]    EKG   Radiology No results found.  Procedures Procedures (including critical care time)  Medications Ordered in UC Medications - No data to display  Initial Impression / Assessment and Plan / UC Course  I have reviewed the triage vital signs and the nursing notes.  Pertinent labs & imaging results that were available during my care of the patient were reviewed by me and considered in my medical decision making (see chart for details).       Pt is a 63 y.o. female who presents for 1 day of respiratory symptoms. Beverly Mercer is afebrile here without recent antipyretics. Satting well on room air. Overall pt is non-toxic appearing, well hydrated, without respiratory distress. Pulmonary exam is unremarkable.  COVID, RSV and influenza panel obtained and was negative. Strep PCR is negative. She doesn't have evidence of acute otitis media.   History most consistent with viral respiratory illness. Discussed symptomatic treatment.  Explained lack of efficacy of antibiotics in viral disease.  Typical duration of symptoms discussed. Promethazine  DM, Tessalong perles and atrovent nasal spray prescribed. Work note provided.   Return and ED precautions given and voiced understanding. Discussed MDM, treatment plan and plan for follow-up with patient who agrees with plan.     Final Clinical Impressions(s) / UC Diagnoses   Final diagnoses:  Viral  URI with cough     Discharge Instructions      Beverly Mercer's strep, RSV, influenza and COVID are all negative. You have a viral respiratory infection that will gradually improve over the next 7-10 days. Cough may last up to 3 weeks.   You can take Tylenol and/or Ibuprofen as needed for fever reduction and pain relief.    For cough:  Stop at the pharmacy to pick up your prescription cough medication. You can use a humidifier for chest congestion and cough.  If you don't have a humidifier, you can sit in the bathroom with the hot shower running.      For sore throat: try warm salt water gargles, Mucinex sore throat cough drops or cepacol lozenges, throat spray, warm tea or water with lemon/honey, popsicles or ice, or OTC cold relief medicine for throat discomfort. You can also purchase chloraseptic spray at the pharmacy or dollar store.   For congestion: use the ipratropium nasal spray prescribe. You can continue your Flonase use.    It is important to stay hydrated: drink plenty of fluids (water, gatorade/powerade/pedialyte, juices, or teas) to keep your throat moisturized and help further relieve irritation/discomfort.    Return or go to the Emergency Department if symptoms worsen or do not improve in the next few days      ED Prescriptions     Medication Sig Dispense Auth. Provider   promethazine-dextromethorphan (PROMETHAZINE-DM) 6.25-15 MG/5ML syrup Take 5 mLs by mouth 4 (four) times daily as needed. 118 mL Darden Flemister, DO   benzonatate (TESSALON) 100 MG capsule Take 1 capsule (100 mg total) by mouth every 8 (eight) hours. 21 capsule Aysiah Jurado, DO   ipratropium  (ATROVENT) 0.06 % nasal spray Place 2 sprays into both nostrils 4 (four) times daily. 15 mL Katha Cabal, DO      PDMP not reviewed this encounter.   Katha Cabal, DO 12/19/23 [phone]

## 2024-01-07 ENCOUNTER — Encounter: Payer: Self-pay | Admitting: Emergency Medicine

## 2024-01-07 ENCOUNTER — Ambulatory Visit
Admission: EM | Admit: 2024-01-07 | Discharge: 2024-01-07 | Disposition: A | Discharge status: Home / Self Care | Payer: Self-pay | Attending: Emergency Medicine | Admitting: Emergency Medicine

## 2024-01-07 DIAGNOSIS — J069 Acute upper respiratory infection, unspecified: Secondary | ICD-10-CM

## 2024-01-07 DIAGNOSIS — H109 Unspecified conjunctivitis: Secondary | ICD-10-CM

## 2024-01-07 MED ORDER — IPRATROPIUM BROMIDE 0.06 % NA SOLN: 2.0000 | Freq: Four times a day (QID) | NASAL | 12 refills | Status: AC

## 2024-01-07 MED ORDER — MOXIFLOXACIN HCL 0.5 % OP SOLN: 1.0000 [drp] | Freq: Three times a day (TID) | OPHTHALMIC | 0 refills | Status: AC

## 2024-01-07 MED ORDER — HYDROCOD POLI-CHLORPHE POLI ER 10-8 MG/5ML PO SUER: 5.0000 mL | Freq: Two times a day (BID) | ORAL | 0 refills | Status: AC | PRN

## 2024-01-07 MED ORDER — BENZONATATE 100 MG PO CAPS: 100.0000 mg | ORAL_CAPSULE | Freq: Three times a day (TID) | ORAL | 0 refills | Status: AC

## 2024-01-07 MED ORDER — AMOXICILLIN-POT CLAVULANATE 875-125 MG PO TABS: 1.0000 | ORAL_TABLET | Freq: Two times a day (BID) | ORAL | 0 refills | Status: AC

## 2024-01-07 NOTE — Discharge Instructions (Addendum)
 Take the Augmentin twice daily with food for 7 days for treatment of your upper respiratory infection.  Use the Atrovent nasal spray, 2 squirts up each nostril every 6 hours, as needed for runny nose nasal congestion.  Continue to use the Tessalon Perles every 8 hours during the day as needed for cough and congestion.  Use the Tussidex at bedtime as needed for cough and congestion.  Instill 1 drop of Vigamox in your left eye every 8 hours for the next 7 days for treatment of your conjunctivitis.  Avoid touching your eyes as much as possible.  Wipe down all surfaces, countertops, and doorknobs after the first and second 24 hours on eyedrops.  Wash her face with a clean wash rag to remove any drainage and use a different portion of the wash rag to clean each eye so as to not reinfect yourself.  Return for reevaluation for any new or worsening symptoms.

## 2024-01-07 NOTE — ED Triage Notes (Signed)
 Pt was seen 2/26 for a cough and is not better.  She is coughing up green mucus and has a runny nose. For the past 3 days she has had left eye redness and drainage.

## 2024-01-07 NOTE — ED Provider Notes (Signed)
 [facility]    CSN: [id number] Arrival date & time: 01/07/24  1311      History   Chief Complaint Chief Complaint  Patient presents with   Cough   Nasal Congestion    HPI Beverly Mercer is a 63 y.o. female.   HPI  63 year old female with past medical history significant for asthma and depression presents for evaluation of 19 days with the respiratory symptoms which includes runny nose, nasal congestion, productive cough for green sputum.  The patient also reports that 3 days ago she developed redness and itching to her left eye as well as some crusty discharge.  No changes in vision however.  She reports that she has been around her grandchildren and one of them had an eye issue.  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      Home Medications    Prior to Admission medications   Medication Sig Start Date End Date Taking? Authorizing Provider  amoxicillin-clavulanate (AUGMENTIN) 875-125 MG tablet Take 1 tablet by mouth every 12 (twelve) hours for 7 days. 01/07/24 01/14/24 Yes Becky Augusta, NP  chlorpheniramine-HYDROcodone (TUSSIONEX) 10-8 MG/5ML Take 5 mLs by mouth every 12 (twelve) hours as needed for cough. 01/07/24  Yes Becky Augusta, NP  moxifloxacin (VIGAMOX) 0.5 % ophthalmic solution Place 1 drop into the left eye 3 (three) times daily for 7 days. 01/07/24 01/14/24 Yes Becky Augusta, NP  albuterol (PROAIR HFA) 108 (90 Base) MCG/ACT inhaler Inhale 1 puff into the lungs 4 (four) times daily. 12/17/15   Duanne Limerick, MD  albuterol (PROVENTIL)  (2.5 MG/3ML) 0.083% nebulizer solution Take 3 mLs (2.5 mg total) by nebulization every 6 (six) hours as needed for wheezing or shortness of breath. 12/17/15   Duanne Limerick, MD  aspirin 81 MG chewable tablet Chew by mouth daily.    [provider]  benzonatate (TESSALON) 100 MG capsule Take 1 capsule (100 mg total) by mouth every 8 (eight) hours. 01/07/24   Becky Augusta, NP  Cholecalciferol (VITAMIN D3) 50 MCG (2000 UT) capsule Take 2,000 Units by mouth daily.    [provider]  fluticasone (FLONASE) 50 MCG/ACT nasal spray Place 2 sprays into both nostrils daily. 07/17/23   Domenick Gong, MD  ipratropium (ATROVENT) 0.06 % nasal spray Place 2 sprays into both nostrils 4 (four) times daily. 01/07/24   Becky Augusta, NP  methocarbamol (ROBAXIN) 500 MG tablet Take 1 tablet (500 mg total) by mouth 2 (two) times daily as needed for muscle spasms. 06/20/23   Brimage, Seward Meth, DO  montelukast (SINGULAIR) 10 MG tablet TAKE 1 TABLET BY MOUTH AT BEDTIME 07/12/20   Duanne Limerick, MD  Multiple Vitamin (MULTIVITAMIN) capsule Take 1 capsule by mouth daily.    [provider]  naproxen (NAPROSYN) 500 MG tablet Take 1 tablet (500 mg total) by mouth 2 (two) times daily with a meal. 06/20/23   Katha Cabal, DO    Family History Family History  Problem Relation Age of Onset   Asthma Neg Hx     Social History Social History   Tobacco Use   Smoking status: Never   Smokeless tobacco: Never  Vaping Use   Vaping status: Never Used  Substance Use Topics   Alcohol use: No    Alcohol/week: 0.0 standard drinks of alcohol   Drug use: No     Allergies   Demerol [meperidine] and Keflex [cephalexin]   Review of Systems Review of Systems  Constitutional:  Negative for fever.  HENT:  Positive for congestion and rhinorrhea. Negative for ear pain and sore throat.   Eyes:  Positive for discharge, redness and itching. Negative for photophobia, pain and visual disturbance.   Respiratory:  Positive for cough. Negative for shortness of breath and wheezing.      Physical Exam Triage Vital Signs ED Triage Vitals [01/07/24 1356]  Encounter Vitals Group     BP      Systolic BP Percentile      Diastolic BP Percentile      Pulse      Resp      Temp      Temp src      SpO2      Weight      Height      Head Circumference      Peak Flow      Pain Score 0     Pain Loc      Pain Education      Exclude from Growth Chart    No data found.  Updated Vital Signs BP (!) 169/85 (BP Location: Right Arm)   Pulse 83   Temp 98.4 F (36.9 C) (Oral)   Resp 16   SpO2 95%   Visual Acuity Right Eye Distance:   Left Eye Distance:   Bilateral Distance:    Right Eye Near:   Left Eye Near:    Bilateral Near:     Physical Exam Vitals and nursing note reviewed.  Constitutional:      Appearance: Normal appearance. She is not ill-appearing.  HENT:     Head: Normocephalic and atraumatic.     Nose: Congestion and rhinorrhea present.     Comments: Nasal mucosa is edematous and erythematous with yellow discharge in both nares.    Mouth/Throat:     Mouth: Mucous membranes are moist.     Pharynx: Oropharynx is clear. No oropharyngeal exudate or posterior oropharyngeal erythema.  Eyes:     Extraocular Movements: Extraocular movements intact.     Pupils: Pupils are equal, round, and reactive to light.     Comments: Bulbar and labral conjunctiva of the left eye are erythematous and injected.  Pupils equal round and reactive and EOM is intact.  Cardiovascular:     Rate and Rhythm: Normal rate.     Pulses: Normal pulses.     Heart sounds: Normal heart sounds. No murmur heard.    No friction rub. No gallop.  Pulmonary:     Effort: Pulmonary effort is normal.     Breath sounds: Normal breath sounds. No wheezing, rhonchi or rales.  Musculoskeletal:     Cervical back: Normal range of motion and neck supple. No tenderness.  Lymphadenopathy:     Cervical: No cervical  adenopathy.  Skin:    General: Skin is warm and dry.     Capillary Refill: Capillary refill takes less than 2 seconds.     Findings: No rash.  Neurological:  General: No focal deficit present.     Mental Status: She is alert and oriented to person, place, and time.      UC Treatments / Results  Labs (all labs ordered are listed, but only abnormal results are displayed) Labs Reviewed - No data to display  EKG   Radiology No results found.  Procedures Procedures (including critical care time)  Medications Ordered in UC Medications - No data to display  Initial Impression / Assessment and Plan / UC Course  I have reviewed the triage vital signs and the nursing notes.  Pertinent labs & imaging results that were available during my care of the patient were reviewed by me and considered in my medical decision making (see chart for details).   Patient is a nontoxic-appearing 63 year old female presenting for evaluation of 19 days worth of respiratory symptoms as outlined HPI above.  She is also complaining of 3 days worth of left eye symptoms as outlined HPI above.  As you can see the image above, the bulbar and the conjunctiva of the left eye are erythematous and injected.  No discharge noted in the lashes or inner canthus.  Pupils equal and reactive and EOM's intact.  The remainder the patient's physical exam reveals edematous and erythematous nasal mucosa with thick yellow discharge in both nares.  Oropharyngeal exam is benign.  No cervical lymphadenopathy present on exam.  Cardiopulmonary exam physical lung sounds in all fields.  Patient exam is consistent with an upper respiratory infection with cough and congestion and left conjunctivitis.  Given that she has had symptoms for 19 days a trial of antibiotics is warranted so I will discharge her home on a 7-day course of Augmentin 875 twice daily.  Also refill her Atrovent nasal spray and Tessalon Perles.  For her conjunctivitis I  will prescribe Vigamox and she can instill 1 drop 3 times daily in her left eye for the next week. 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 She has no open scripts in PDMP.  Final Clinical Impressions(s) / UC Diagnoses   Final diagnoses:  URI with cough and congestion  Bacterial conjunctivitis     Discharge Instructions      Take the Augmentin twice daily with food for 7 days for treatment of your upper respiratory infection.  Use the Atrovent nasal spray, 2 squirts up each nostril every 6 hours, as needed for runny nose nasal congestion.  Continue to use the Tessalon Perles every 8 hours during the day as needed for cough and congestion.  Use the Tussidex at bedtime as needed for cough and congestion.  Instill 1 drop of Vigamox in your left eye every 8 hours for the next 7 days for treatment of your conjunctivitis.  Avoid touching your eyes as much as possible.  Wipe down all surfaces, countertops, and doorknobs after the first and second 24 hours on eyedrops.  Wash her face with a clean wash rag to remove any drainage and use a different portion of the wash rag to clean each eye so as to not reinfect yourself.  Return for reevaluation for any new or worsening symptoms.      ED Prescriptions     Medication Sig Dispense Auth. Provider   amoxicillin-clavulanate (AUGMENTIN) 875-125 MG tablet Take 1 tablet by mouth every 12 (twelve) hours for 7 days. 14 tablet Becky Augusta, NP   ipratropium (ATROVENT) 0.06 % nasal spray Place 2 sprays  into both nostrils 4 (four) times daily. 15 mL Becky Augusta, NP   benzonatate (TESSALON) 100 MG capsule Take 1 capsule (100 mg total) by mouth every 8 (eight) hours. 21 capsule Becky Augusta, NP   chlorpheniramine-HYDROcodone (TUSSIONEX) 10-8 MG/5ML Take 5 mLs by mouth every 12 (twelve) hours as needed for cough. 70 mL Becky Augusta, NP   moxifloxacin (VIGAMOX) 0.5 % ophthalmic  solution Place 1 drop into the left eye 3 (three) times daily for 7 days. 3 mL Becky Augusta, NP      I have reviewed the PDMP during this encounter.   Becky Augusta, NP 01/07/24 1419
